# Patient Record
Sex: Male | Born: 1970 | Race: White | Hispanic: Yes | Marital: Married | Smoking: Never smoker
Health system: Southern US, Community
[De-identification: ages and names within clinical notes are randomized; demographics above are authoritative.]

## PROBLEM LIST (undated history)

## (undated) DIAGNOSIS — H1010 Acute atopic conjunctivitis, unspecified eye: Secondary | ICD-10-CM

## (undated) DIAGNOSIS — E7800 Pure hypercholesterolemia, unspecified: Secondary | ICD-10-CM

## (undated) HISTORY — DX: Pure hypercholesterolemia, unspecified: E78.00

## (undated) HISTORY — DX: Acute atopic conjunctivitis, unspecified eye: H10.10

---

## 2000-09-02 HISTORY — PX: APPENDECTOMY: SHX54

## 2008-06-07 ENCOUNTER — Emergency Department (HOSPITAL_COMMUNITY): Admission: EM | Admit: 2008-06-07 | Discharge: 2008-06-07 | Payer: Self-pay | Admitting: Emergency Medicine

## 2008-06-29 ENCOUNTER — Ambulatory Visit: Payer: Self-pay | Admitting: Internal Medicine

## 2008-06-30 ENCOUNTER — Ambulatory Visit: Payer: Self-pay | Admitting: *Deleted

## 2008-08-17 ENCOUNTER — Encounter: Payer: Self-pay | Admitting: Family Medicine

## 2008-08-17 ENCOUNTER — Ambulatory Visit: Payer: Self-pay | Admitting: Internal Medicine

## 2008-08-17 LAB — CONVERTED CEMR LAB
Alkaline Phosphatase: 84 units/L (ref 39–117)
Basophils Absolute: 0 10*3/uL (ref 0.0–0.1)
Basophils Relative: 0 % (ref 0–1)
Cholesterol: 217 mg/dL — ABNORMAL HIGH (ref 0–200)
Creatinine, Ser: 1.06 mg/dL (ref 0.40–1.50)
Eosinophils Relative: 2 % (ref 0–5)
Glucose, Bld: 97 mg/dL (ref 70–99)
HDL: 57 mg/dL (ref >39)
Hemoglobin: 16.6 g/dL (ref 13.0–17.0)
LDL Cholesterol: 145 mg/dL — ABNORMAL HIGH (ref 0–99)
Lymphs Abs: 1.7 10*3/uL (ref 0.7–4.0)
MCHC: 35 g/dL (ref 30.0–36.0)
Monocytes Absolute: 0.5 10*3/uL (ref 0.1–1.0)
Monocytes Relative: 8 % (ref 3–12)
Neutro Abs: 3.4 10*3/uL (ref 1.7–7.7)
Neutrophils Relative %: 60 % (ref 43–77)
Potassium: 3.9 meq/L (ref 3.5–5.3)
TSH: 1.473 microintl units/mL (ref 0.350–4.50)
Total Bilirubin: 0.8 mg/dL (ref 0.3–1.2)
Total CHOL/HDL Ratio: 3.8
Triglycerides: 73 mg/dL (ref <150)
VLDL: 15 mg/dL (ref 0–40)

## 2008-09-07 ENCOUNTER — Ambulatory Visit: Payer: Self-pay | Admitting: Internal Medicine

## 2009-02-08 ENCOUNTER — Ambulatory Visit: Payer: Self-pay | Admitting: Internal Medicine

## 2009-02-15 ENCOUNTER — Ambulatory Visit: Payer: Self-pay | Admitting: Family Medicine

## 2009-07-31 ENCOUNTER — Ambulatory Visit: Payer: Self-pay | Admitting: Internal Medicine

## 2009-08-29 ENCOUNTER — Ambulatory Visit: Payer: Self-pay | Admitting: Family Medicine

## 2011-06-04 LAB — POCT URINALYSIS DIP (DEVICE)
Bilirubin Urine: NEGATIVE
Glucose, UA: NEGATIVE
Hgb urine dipstick: NEGATIVE
Nitrite: NEGATIVE
Operator id: 30745
pH: 7

## 2013-09-22 ENCOUNTER — Ambulatory Visit: Payer: Self-pay | Admitting: Physician Assistant

## 2013-09-22 VITALS — BP 112/70 | HR 53 | Temp 98.2°F | Resp 16 | Ht 66.0 in | Wt 169.0 lb

## 2013-09-22 DIAGNOSIS — R21 Rash and other nonspecific skin eruption: Secondary | ICD-10-CM

## 2013-09-22 MED ORDER — DOXYCYCLINE HYCLATE 100 MG PO TABS: 100.0000 mg | ORAL_TABLET | Freq: Two times a day (BID) | ORAL | Status: DC

## 2013-09-22 MED ORDER — TRIAMCINOLONE ACETONIDE 0.1 % EX CREA: 1.0000 "application " | TOPICAL_CREAM | Freq: Every day | CUTANEOUS | Status: DC

## 2013-09-22 NOTE — Progress Notes (Signed)
   Subjective:    Patient ID: Joel Todd, male    DOB: [birth year], 43 y.o.   MRN: [id number]  HPI  Pt presents to clinic with a rash on his face for the last 3 weeks.  He thought that they were pimples at 1st but they never got worse but they also did not improve.  He has been on Amoxil for the last 3 days that he started for a sore throat but he has noticed no difference in his rash.  The rash is slightly itchy but when he pushes on the bumps they are sore.  They have not spread since they started.  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  He has not changed andy soaps or lotions.  He has used nothing for the rash.  Review of Systems     Objective:   Physical Exam  Vitals reviewed. Constitutional: He is oriented to person, place, and time. He appears well-developed and well-nourished.  HENT:  Head: Normocephalic and atraumatic.  Right Ear: External ear normal.  Left Ear: External ear normal.  Eyes: Conjunctivae are normal.  Pulmonary/Chest: Effort normal.  Neurological: He is alert and oriented to person, place, and time.  Skin: Skin is warm and dry. Rash (erythematous papular rash on cheeks across nose - there are no pustules present.  The rash does not extend into his facial hair area.  They are slighty  indurated when palpated and slightly TTP.) noted.  Psychiatric: He has a normal mood and affect. His behavior is normal. Judgment and thought content normal.      Assessment & Plan:  Rash of face - Plan: doxycycline (VIBRA-TABS) 100 MG tablet, triamcinolone cream (KENALOG) 0.1 %  This is an unusual place for folliculitis but it definitely has the appearance of that. If this does not resolve or it returns we need to consider possible Rosacea due to its distribution.  He currently has no systemic symptoms but if they would occur a biopsy would be helpful if the rash does not resolve.  He understands with the above  plan.  Benny LennertSarah Weber PA-C 09/22/2013 10:55 AM

## 2014-05-10 ENCOUNTER — Ambulatory Visit (INDEPENDENT_AMBULATORY_CARE_PROVIDER_SITE_OTHER): Payer: Self-pay | Admitting: Family Medicine

## 2014-05-10 VITALS — BP 116/72 | HR 60 | Temp 97.8°F | Resp 16 | Ht 66.0 in | Wt 167.8 lb

## 2014-05-10 DIAGNOSIS — R142 Eructation: Secondary | ICD-10-CM

## 2014-05-10 DIAGNOSIS — R140 Abdominal distension (gaseous): Secondary | ICD-10-CM

## 2014-05-10 DIAGNOSIS — R143 Flatulence: Secondary | ICD-10-CM

## 2014-05-10 DIAGNOSIS — R1031 Right lower quadrant pain: Secondary | ICD-10-CM

## 2014-05-10 DIAGNOSIS — K5900 Constipation, unspecified: Secondary | ICD-10-CM

## 2014-05-10 DIAGNOSIS — R141 Gas pain: Secondary | ICD-10-CM

## 2014-05-10 LAB — POCT CBC
GRANULOCYTE PERCENT: 72.5 % (ref 37–80)
HCT, POC: 46.2 % (ref 43.5–53.7)
HEMOGLOBIN: 15.7 g/dL (ref 14.1–18.1)
LYMPH, POC: 1.4 (ref 0.6–3.4)
MCH, POC: 31.9 pg — AB (ref 27–31.2)
MCHC: 33.9 g/dL (ref 31.8–35.4)
MCV: 93.9 fL (ref 80–97)
MID (CBC): 0.3 (ref 0–0.9)
MPV: 7.7 fL (ref 0–99.8)
POC GRANULOCYTE: 4.4 (ref 2–6.9)
POC LYMPH %: 22.9 % (ref 10–50)
POC MID %: 4.6 % (ref 0–12)
Platelet Count, POC: 21 10*3/uL — AB (ref 142–424)
RBC: 4.91 M/uL (ref 4.69–6.13)
RDW, POC: 13.4 %
WBC: 6.1 10*3/uL (ref 4.6–10.2)

## 2014-05-10 MED ORDER — OMEPRAZOLE 20 MG PO CPDR: 20.0000 mg | DELAYED_RELEASE_CAPSULE | Freq: Every day | ORAL | Status: DC

## 2014-05-10 NOTE — Progress Notes (Signed)
Subjective:    Patient ID: Joel Todd, male    DOB: 09-13-1970, 43 y.o.   MRN: [id number]  HPI Joel Todd is a 43 y.o. male  Here with c/o abdominal pain.  Started with small pinch in lower abdomen 3 weeks ago, then noticed stomach swells after eating over past 3 weeks. Swelling improves after few hours.  Increased gas with flatus and belching. Symptoms same, no worsening, just persistent swelling after eating and soreness in lower right abdomen. No dysuria, no hematuria.  No fever. No n/v, no diarrhea. BM QD now, but every other day initially - denies hard stools, no rectal bleeding.  No prior similar sx's. No weight loss, no night sweats.   Tx: none   SH: alcohol on weekends, 8 beers at a time, but not every weekend. Not drinking during the week.    There are no active problems to display for this patient.  History reviewed. No pertinent past medical history. History reviewed. No pertinent past surgical history. No Known Allergies Prior to Admission medications   Not on File   History   Social History  . Marital Status: Married    Spouse Name: N/A    Number of Children: N/A  . Years of Education: N/A   Occupational History  . Not on file.   Social History Main Topics  . Smoking status: Never Smoker   . Smokeless tobacco: Not on file  . Alcohol Use: Not on file  . Drug Use: Not on file  . Sexual Activity: Not on file   Other Topics Concern  . Not on file   Social History Narrative  . No narrative on file       Review of Systems  Constitutional: Negative for fever and chills.  Gastrointestinal: Positive for abdominal pain and abdominal distention. Negative for nausea, vomiting, diarrhea, blood in stool and anal bleeding.  Genitourinary: Negative for dysuria, urgency, frequency, hematuria and difficulty urinating.  Skin: Negative for rash.       Objective:   Physical Exam  Vitals reviewed. Constitutional: He is oriented to  person, place, and time. He appears well-developed and well-nourished.  HENT:  Head: Normocephalic and atraumatic.  Eyes: EOM are normal. Pupils are equal, round, and reactive to light.  Neck: No JVD present. Carotid bruit is not present.  Cardiovascular: Normal rate, regular rhythm and normal heart sounds.   No murmur heard. Pulmonary/Chest: Effort normal and breath sounds normal. He has no rales.  Abdominal: There is tenderness (min RLQ only. ) in the right lower quadrant. There is no rebound, no CVA tenderness and negative Murphy's sign. McBurney's point: minimal over mcburneys with deep palpation. No hernia. Hernia confirmed negative in the right inguinal area and confirmed negative in the left inguinal area.  Musculoskeletal: He exhibits no edema.  Neurological: He is alert and oriented to person, place, and time.  Skin: Skin is warm, dry and intact.  Psychiatric: He has a normal mood and affect.   Filed Vitals:   05/10/14 0930  BP: 116/72  Pulse: 60  Temp: 97.8 F (36.6 C)  TempSrc: Oral  Resp: 16  Height:  (1.676 m)  Weight: 167 lb 12.8 oz (76.114 kg)  SpO2: 100%    Results for orders placed in visit on 05/10/14  POCT CBC      Result Value Ref Range   WBC 6.1  4.6 - 10.2 K/uL   Lymph, poc 1.4  0.6 - 3.4   POC LYMPH  PERCENT 22.9  10 - 50 %L   MID (cbc) 0.3  0 - 0.9   POC MID % 4.6  0 - 12 %M   POC Granulocyte 4.4  2 - 6.9   Granulocyte percent 72.5  37 - 80 %G   RBC 4.91  4.69 - 6.13 M/uL   Hemoglobin 15.7  14.1 - 18.1 g/dL   HCT, POC 16.1  09.6 - 53.7 %   MCV 93.9  80 - 97 fL   MCH, POC 31.9 (*) 27 - 31.2 pg   MCHC 33.9  31.8 - 35.4 g/dL   RDW, POC 04.5     Platelet Count, POC 21 (*) 142 - 424 K/uL   MPV 7.7  0 - 99.8 fL      Assessment & Plan:   Joel Todd is a 43 y.o. male Right lower quadrant abdominal pain - Plan: POCT CBC, Comprehensive metabolic panel, omeprazole (PRILOSEC) 20 MG capsule  Bloating - Plan: POCT CBC, Comprehensive  metabolic panel, omeprazole (PRILOSEC) 20 MG capsule  Unspecified constipation  Initial constipation likely, and may still be contributory. Reassuring CBC and does not appear distended on exam. Trial of colace, miralax if needed, bland foods, and with belching component- trial of PPI short term.  Check CMP with alcohol use history.  RTC if sx's persist - may need U/S or other imaging. Understanding expressed - portions discussed in Spanish.   Meds ordered this encounter  Medications  . omeprazole (PRILOSEC) 20 MG capsule    Sig: Take 1 capsule (20 mg total) by mouth daily.    Dispense:  30 capsule    Refill:  1   Patient Instructions  Avoid alcohol, spicy food. Fried or fatty foods. You should receive a call or letter about your lab results within the next week to 10 days. Start omeprazole  once per day. Ok to use stool softener - Colace once per day to soften stools, or if no bowel movement that day - can use miralax over the counter up to once per day. Recheck in next 4-5 days to determine next step Return to the clinic or go to the nearest emergency room if any of your symptoms worsen or new symptoms occur.  Abdominal Pain Many things can cause abdominal pain. Usually, abdominal pain is not caused by a disease and will improve without treatment. It can often be observed and treated at home. Your health care provider will do a physical exam and possibly order blood tests and X-rays to help determine the seriousness of your pain. However, in many cases, more time must pass before a clear cause of the pain can be found. Before that point, your health care provider may not know if you need more testing or further treatment. HOME CARE INSTRUCTIONS  Monitor your abdominal pain for any changes. The following actions may help to alleviate any discomfort you are experiencing:  Only take over-the-counter or prescription medicines as directed by your health care provider.  Do not take laxatives  unless directed to do so by your health care provider.  Try a clear liquid diet (broth, tea, or water) as directed by your health care provider. Slowly move to a bland diet as tolerated. SEEK MEDICAL CARE IF:  You have unexplained abdominal pain.  You have abdominal pain associated with nausea or diarrhea.  You have pain when you urinate or have a bowel movement.  You experience abdominal pain that wakes you in the night.  You have abdominal  pain that is worsened or improved by eating food.  You have abdominal pain that is worsened with eating fatty foods.  You have a fever. SEEK IMMEDIATE MEDICAL CARE IF:   Your pain does not go away within 2 hours.  You keep throwing up (vomiting).  Your pain is felt only in portions of the abdomen, such as the right side or the left lower portion of the abdomen.  You pass bloody or black tarry stools. MAKE SURE YOU:  Understand these instructions.   Will watch your condition.   Will get help right away if you are not doing well or get worse.  Document Released: 05/29/2005 Document Revised: 08/24/2013 Document Reviewed: 04/28/2013 [facility] Patient Information 2015 [place], [place]. This information is not intended to replace advice given to you by your health care provider. Make sure you discuss any questions you have with your health care provider.

## 2014-05-10 NOTE — Patient Instructions (Addendum)
Avoid alcohol, spicy food. Fried or fatty foods. You should receive a call or letter about your lab results within the next week to 10 days. Start omeprazole  once per day. Ok to use stool softener - Colace once per day to soften stools, or if no bowel movement that day - can use miralax over the counter up to once per day. Recheck in next 4-5 days to determine next step Return to the clinic or go to the nearest emergency room if any of your symptoms worsen or new symptoms occur.  Abdominal Pain Many things can cause abdominal pain. Usually, abdominal pain is not caused by a disease and will improve without treatment. It can often be observed and treated at home. Your health care provider will do a physical exam and possibly order blood tests and X-rays to help determine the seriousness of your pain. However, in many cases, more time must pass before a clear cause of the pain can be found. Before that point, your health care provider may not know if you need more testing or further treatment. HOME CARE INSTRUCTIONS  Monitor your abdominal pain for any changes. The following actions may help to alleviate any discomfort you are experiencing:  Only take over-the-counter or prescription medicines as directed by your health care provider.  Do not take laxatives unless directed to do so by your health care provider.  Try a clear liquid diet (broth, tea, or water) as directed by your health care provider. Slowly move to a bland diet as tolerated. SEEK MEDICAL CARE IF:  You have unexplained abdominal pain.  You have abdominal pain associated with nausea or diarrhea.  You have pain when you urinate or have a bowel movement.  You experience abdominal pain that wakes you in the night.  You have abdominal pain that is worsened or improved by eating food.  You have abdominal pain that is worsened with eating fatty foods.  You have a fever. SEEK IMMEDIATE MEDICAL CARE IF:   Your pain does not go  away within 2 hours.  You keep throwing up (vomiting).  Your pain is felt only in portions of the abdomen, such as the right side or the left lower portion of the abdomen.  You pass bloody or black tarry stools. MAKE SURE YOU:  Understand these instructions.   Will watch your condition.   Will get help right away if you are not doing well or get worse.  Document Released: 05/29/2005 Document Revised: 08/24/2013 Document Reviewed: 04/28/2013 [facility] Patient Information 2015 [place], [place]. This information is not intended to replace advice given to you by your health care provider. Make sure you discuss any questions you have with your health care provider.

## 2014-05-11 LAB — COMPREHENSIVE METABOLIC PANEL
ALK PHOS: 57 U/L (ref 39–117)
ALT: 13 U/L (ref 0–53)
AST: 16 U/L (ref 0–37)
Albumin: 4.6 g/dL (ref 3.5–5.2)
BUN: 13 mg/dL (ref 6–23)
CALCIUM: 9.7 mg/dL (ref 8.4–10.5)
CO2: 30 meq/L (ref 19–32)
CREATININE: 0.84 mg/dL (ref 0.50–1.35)
Chloride: 103 mEq/L (ref 96–112)
Glucose, Bld: 94 mg/dL (ref 70–99)
POTASSIUM: 4.5 meq/L (ref 3.5–5.3)
SODIUM: 141 meq/L (ref 135–145)
Total Bilirubin: 0.5 mg/dL (ref 0.2–1.2)
Total Protein: 7.2 g/dL (ref 6.0–8.3)

## 2015-05-08 ENCOUNTER — Ambulatory Visit (INDEPENDENT_AMBULATORY_CARE_PROVIDER_SITE_OTHER): Payer: Self-pay | Admitting: Family Medicine

## 2015-05-08 VITALS — BP 104/76 | HR 80 | Temp 98.0°F | Resp 14 | Ht 66.0 in | Wt 169.0 lb

## 2015-05-08 DIAGNOSIS — G44209 Tension-type headache, unspecified, not intractable: Secondary | ICD-10-CM

## 2015-05-08 DIAGNOSIS — M79622 Pain in left upper arm: Secondary | ICD-10-CM

## 2015-05-08 DIAGNOSIS — L719 Rosacea, unspecified: Secondary | ICD-10-CM

## 2015-05-08 DIAGNOSIS — R079 Chest pain, unspecified: Secondary | ICD-10-CM

## 2015-05-08 DIAGNOSIS — R42 Dizziness and giddiness: Secondary | ICD-10-CM

## 2015-05-08 DIAGNOSIS — H9202 Otalgia, left ear: Secondary | ICD-10-CM

## 2015-05-08 LAB — GLUCOSE, POCT (MANUAL RESULT ENTRY): POC Glucose: 85 mg/dl (ref 70–99)

## 2015-05-08 MED ORDER — BUTALBITAL-APAP-CAFFEINE 50-325-40 MG PO TABS: 1.0000 | ORAL_TABLET | Freq: Four times a day (QID) | ORAL | Status: AC | PRN

## 2015-05-08 MED ORDER — MECLIZINE HCL 25 MG PO TABS
ORAL_TABLET | ORAL | Status: DC
Start: 2015-05-08 — End: 2016-03-22

## 2015-05-08 MED ORDER — METRONIDAZOLE 1 % EX GEL: CUTANEOUS | Status: DC

## 2015-05-08 NOTE — Progress Notes (Signed)
Dizziness Subjective:  Patient ID: Joel Todd, male    DOB: 1970-12-17  Age: 44 y.o. MRN: [id number]  44 year old man who has been having problems with dizziness last few days. It is not totally predictable. Sometimes he can be working and just get a little lightheaded dizziness. He does not have enough dizziness to cause him any nausea or vomiting. It is not bothered him when he turns over in bed. Sometimes it is a little bit positional. He has not had problems with this in the past. He has had a little bit of a shooting pain in his left ear since yesterday. He does not smoke. He drinks occasionally. He does not use any drugs. He knows of no head injuries. He has been having some headaches. He is not hurting right this minute. He has had some pains in his left shoulder. 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 He has a family. He is generally healthy. He is not on any regular medications and is not taking any OTC medications. He also describes some intermittent shooting type pains in his left upper chest like an electrical impulse going through him.   Objective:   Healthy-appearing gentleman in no major distress. His TMs are entirely normal. Eyes PERRLA. Fundi benign. Throat clear. Neck supple without nodes or thyromegaly. No carotid bruits. Chest is clear to auscultation. Heart regular without murmurs, gallops, or arrhythmias. Abdomen soft. Motor symmetrical. Head position does not seem to trigger dizziness.  Assessment & Plan:   Assessment:  Nonspecific dizziness Left otalgia  Plan:  Check a blood sugar on him. Because of the left shoulder pains we'll also get an EKG.  Normal EKG  Results for orders placed or performed in visit on 05/08/15  POCT glucose (manual entry)  Result Value Ref Range   POC Glucose 85 70 - 99 mg/dl     Patient incidentally also complained of a rash on his face that he has had for the past year. This appears to be rosacea.  Will treat him for that.  The dizziness is nonspecific. Will treat with Antivert. Gave some Fioricet for when necessary use for headaches. Urged him to get regular exercise.  He is to return if needed.   Patient Instructions  Drink lots of water  Advise getting regular exercise such as taking a daily walk to help you relax  Take the meclizine one half to one pill 2 or 3 times a day only when needed for dizziness  Take the Fioricet (butalbutal) 1 pill every 6 hours when needed for headache. Mareos (Dizziness) Los mareos son un problema muy frecuente. Se trata de una sensacin de inestabilidad o aturdimiento. Puede sentir que se va a desvanecer. Puede provocarle una lesin si se tropieza o se cae. Las Dealer de cualquier edad pueden sufrir mareos, pero es ms frecuente [employer] ancianos. CAUSAS  La causa puede deberse a muchos problemas diferentes:  Problemas en el odo medio.  Estar de pie [employer]Ex.  Infecciones.  Reacciones alrgicas.  Envejecimiento.  Respuesta emocional a distintas cosas, como por ejemplo la visin de sangre.  Efectos secundarios de Nature conservation officer.  Cansancio.  Problemas circulatorios o de presin arterial.  Consumo excesivo de alcohol, medicamentos o drogas.  Respiracin muy rpida (hiperventilacin).  Ritmo cardaco irregular (arritmia).  Bajo recuento de glbulos rojos (anemia).  Embarazo.  Vmitos, diarrea, fiebre u otras enfermedades que causan la prdida de lquidos (deshidratacin).  Enfermedades o trastornos, como enfermedad de  Parkinson, presin alta (hipertensin arterial), diabetes y problemas tiroideos.  Exposicin al calor extremo. DIAGNSTICO  El mdico le preguntar acerca de los sntomas, y Education officer, environmental un examen fsico y un electrocardiograma (ECG) para registrar la actividad elctrica del corazn. Tambin podr realizarle otras pruebas cardacas o anlisis de sangre para determinar la causa de los [place]. Estos  pueden ser:  Ecocardiograma transtorcico (ETT). Durante Management consultant, se usan ondas sonoras para evaluar cmo fluye la sangre por el corazn.  Ecocardiograma transesofgico (ETE).  Monitoreo cardaco. Este estudio permite que el mdico controle la frecuencia y el ritmo cardaco en tiempo real.  Monitor Holter. Es un dispositivo porttil que [employer] latidos cardacos y [place] a Education administrator las arritmias cardacas. Le permite al [employer] registrar la actividad cardaca durante varios das, si es necesario.  Pruebas de estrs por ejercicio o por medicamentos que aceleran los latidos cardacos. TRATAMIENTO  El tratamiento de los mareos depende de la causa de los sntomas y Advertising account planner. INSTRUCCIONES PARA EL CUIDADO EN EL HOGAR   Beba suficiente lquido para mantener la orina clara o de color amarillo plido. Esto es realmente importante cuando el clima es muy caluroso. En los adultos [place], tambin es importante cuando hace fro.  Tome los medicamentos exactamente como se lo hayan indicado, si los mareos se deben a los medicamentos. Cuando tome medicamentos para la presin arterial, es muy importante que se levante lentamente.  Levntese de las sillas con lentitud y apyese hasta sentirse bien.  Por la maana, sintese primero a un lado de la cama. Cuando se sienta bien, pngase lentamente de [address] se sostiene de algo, hasta que sepa que ha logrado el equilibrio.  Mueva las piernas con frecuencia si debe estar de pie en un lugar durante mucho tiempo. Mientras est de pie, contraiga y relaje los msculos de las piernas.  Pdale a alguna persona que lo acompae durante 1 o 2das si los mareos siguen siendo un problema. Debe estar acompaado hasta que se sienta lo suficientemente bien como para estar solo. Pdale a la persona que llame al mdico si observa cambios en usted que sean preocupantes.  No conduzca vehculos ni utilice maquinarias pesadas si se siente  mareado.  No beba alcohol. SOLICITE ATENCIN MDICA DE INMEDIATO SI:   Los mareos o el aturdimiento [place].  Siente nuseas o vomita.  Tiene dificultad para hablar, para caminar o para [employer], las manos o las piernas.  Se siente dbil.  No piensa con claridad o tiene dificultades para armar oraciones. Es posible que un amigo o un familiar adviertan que esto ocurre.  Tiene dolor de pecho, dolor abdominal, sudoracin o Company secretary.  Hay cambios en la visin.  Observa un sangrado.  Tiene efectos secundarios del medicamento que parecen Consulting civil engineer de Scientist, clinical (histocompatibility and immunogenetics). ASEGRESE DE QUE:   Comprende estas instrucciones.  Controlar su afeccin.  Recibir ayuda de inmediato si no mejora o si empeora. Document Released: 08/19/2005 Document Revised: 08/24/2013 [facility] Patient Information 2015 [place], [place]. This information is not intended to replace advice given to you by your health care provider. Make sure you discuss any questions you have with your health care provider. Roscea  (Rosacea)  La roscea es una enfermedad crnica que afecta la piel de la cara ([place], nariz, frente y [place]) y en algunos casos, los ojos. La roscea hace que los vasos sanguneos que se encuentran cerca de la superficie de la piel se agranden y causen enrojecimiento. Esta afeccin generalmente comienza  despus de los 30 aos. Se presenta con mayor frecuencia en mujeres de piel clara. Si no se trata, tiende a Product/process development scientist. No hay cura para la roscea, pero el tratamiento puede ayudar a [employer] sntomas.  CAUSAS  La causa es desconocida. Se cree que algunas personas heredan la tendencia a Environmental education officer roscea. Algunos factores desencadenantes pueden hacer que empeore, por ejemplo:   Los baos calientes.  La actividad fsica.  La luz del sol.  Las temperaturas muy clidas o fras.  Los alimentos y las bebidas muy calientes o condimentadas.  El consumo de  alcohol.  El estrs.  Los medicamentos para la presin arterial.  El uso de corticoides tpicos en el rostro por un tiempo prolongado. SNTOMAS   Enrojecimiento del rostro.  Bultos o granos rojos en la cara.  Ocie [place], agrandada (rinofima).  Se ruboriza fcilmente.  Lneas rojas en la piel.  Irritacin o sensacin de ardor en los ojos.  Prpados hinchados. DIAGNSTICO  El mdico podr hacer un diagnstico preguntndole los sntomas y haciendo un examen fsico.  Pharmacist, hospital lo que la ocasiona es una parte importante del [place]. Tambin tendr que ver a un especialista de la piel (dermatlogo) para que realice un plan de tratamiento para usted. Los [employer] del tratamiento son Scientist, physiological enfermedad y Scientist, clinical (histocompatibility and immunogenetics) la apariencia de su piel. Puede llevar varias semanas o meses de tratamiento antes de que note una mejora en la piel. An despus de que su piel mejore, es probable que necesite continuar con el tratamiento para evitar que la roscea vuelva a Research officer, trade union. Los mtodos de tratamiento pueden ser:   Uso de Environmental manager o pantalla solar todos los das para proteger la piel.  Antibiticos, como el metronidazol, aplicado directamente [employer].  Antibiticos por va oral. Se recetan en caso de que tenga problemas en los ojos debido a la roscea.  Ciruga con lser para mejorar la apariencia de la piel. Esta ciruga puede reducir la aparicin de lneas rojas en la piel y eliminar el exceso de tejido de la nariz para reducir su tamao. INSTRUCCIONES PARA EL CUIDADO EN EL HOGAR   Evite lo que parece causar los brotes.  Si le han recetado antibiticos, tmelos segn las indicaciones. Tmelos todos, aunque se sienta mejor.  Use un limpiador facial suave que no contenga alcohol.  Puede utilizar un humectante facial suave.  Use pantalla solar con SPF 30  ms.  Si es necesario, use una base en polvo con tinte verde para ocultar el enrojecimiento. Elija cosmticos no  comedognicos. Esto significa que no obstruyen los poros.  Si tiene [employer] prpados, aplique un pao tibio. Cambie el vendaje 4 veces por da o como le aconsej su mdico. SOLICITE ATENCIN MDICA SI:   Los problemas de la piel empeoran.  Se siente deprimido.  Pierde el apetito.  Tiene dificultad para respirar.  Tiene problemas en los ojos, como enrojecimiento o picazn. ASEGRESE DE QUE:   Comprende estas instrucciones.  Controlar su enfermedad.  Solicitar ayuda de inmediato si no mejora o si empeora. Document Released: 08/19/2005 Document Revised: 02/18/2012 [facility] Patient Information 2015 [place], [place]. This information is not intended to replace advice given to you by your health care provider. Make sure you discuss any questions you have with your health care provider.      HOPPER,DAVID, MD 05/08/2015

## 2015-05-08 NOTE — Patient Instructions (Addendum)
Drink lots of water  Advise getting regular exercise such as taking a daily walk to help you relax  Take the meclizine one half to one pill 2 or 3 times a day only when needed for dizziness  Take the Fioricet (butalbutal) 1 pill every 6 hours when needed for headache.  Use the MetroGel twice daily on the face for a couple of weeks, then once daily. Mareos (Dizziness) Los mareos son un problema muy frecuente. Se trata de una sensacin de inestabilidad o aturdimiento. Puede sentir que se va a desvanecer. Puede provocarle una lesin si se tropieza o se cae. Las Dealer de cualquier edad pueden sufrir mareos, pero es ms frecuente [employer] ancianos. CAUSAS  La causa puede deberse a muchos problemas diferentes:  Problemas en el odo medio.  Estar de pie FedEx.  Infecciones.  Reacciones alrgicas.  Envejecimiento.  Respuesta emocional a distintas cosas, como por ejemplo la visin de sangre.  Efectos secundarios de Nature conservation officer.  Cansancio.  Problemas circulatorios o de presin arterial.  Consumo excesivo de alcohol, medicamentos o drogas.  Respiracin muy rpida (hiperventilacin).  Ritmo cardaco irregular (arritmia).  Bajo recuento de glbulos rojos (anemia).  Embarazo.  Vmitos, diarrea, fiebre u otras enfermedades que causan la prdida de lquidos (deshidratacin).  Enfermedades o trastornos, como enfermedad de Parkinson, presin alta (hipertensin arterial), diabetes y problemas tiroideos.  Exposicin al calor extremo. DIAGNSTICO  El mdico le preguntar acerca de los sntomas, y Education officer, environmental un examen fsico y un electrocardiograma (ECG) para registrar la actividad elctrica del corazn. Tambin podr realizarle otras pruebas cardacas o anlisis de sangre para determinar la causa de los [place]. Estos pueden ser:  Ecocardiograma transtorcico (ETT). Durante Management consultant, se usan ondas sonoras para evaluar cmo fluye la sangre por el  corazn.  Ecocardiograma transesofgico (ETE).  Monitoreo cardaco. Este estudio permite que el mdico controle la frecuencia y el ritmo cardaco en tiempo real.  Monitor Holter. Es un dispositivo porttil que eBay latidos cardacos y [place] a Education administrator las arritmias cardacas. Le permite al [employer] registrar la actividad cardaca durante varios das, si es necesario.  Pruebas de estrs por ejercicio o por medicamentos que aceleran los latidos cardacos. TRATAMIENTO  El tratamiento de los mareos depende de la causa de los sntomas y Advertising account planner. INSTRUCCIONES PARA EL CUIDADO EN EL HOGAR   Beba suficiente lquido para mantener la orina clara o de color amarillo plido. Esto es realmente importante cuando el clima es muy caluroso. En los adultos [place], tambin es importante cuando hace fro.  Tome los medicamentos exactamente como se lo hayan indicado, si los mareos se deben a los medicamentos. Cuando tome medicamentos para la presin arterial, es muy importante que se levante lentamente.  Levntese de las sillas con lentitud y apyese hasta sentirse bien.  Por la maana, sintese primero a un lado de la cama. Cuando se sienta bien, pngase lentamente de [address] se sostiene de algo, hasta que sepa que ha logrado el equilibrio.  Mueva las piernas con frecuencia si debe estar de pie en un lugar durante mucho tiempo. Mientras est de pie, contraiga y relaje los msculos de las piernas.  Pdale a alguna persona que lo acompae durante 1 o 2das si los mareos siguen siendo un problema. Debe estar acompaado hasta que se sienta lo suficientemente bien como para estar solo. Pdale a la persona que llame al mdico si observa cambios en usted que sean preocupantes.  No conduzca vehculos ni utilice maquinarias pesadas  si se siente mareado.  No beba alcohol. SOLICITE ATENCIN MDICA DE INMEDIATO SI:   Los mareos o el aturdimiento [place].  Siente nuseas o  vomita.  Tiene dificultad para hablar, para caminar o para [employer], las manos o las piernas.  Se siente dbil.  No piensa con claridad o tiene dificultades para armar oraciones. Es posible que un amigo o un familiar adviertan que esto ocurre.  Tiene dolor de pecho, dolor abdominal, sudoracin o Company secretary.  Hay cambios en la visin.  Observa un sangrado.  Tiene efectos secundarios del medicamento que parecen Consulting civil engineer de Scientist, clinical (histocompatibility and immunogenetics). ASEGRESE DE QUE:   Comprende estas instrucciones.  Controlar su afeccin.  Recibir ayuda de inmediato si no mejora o si empeora. Document Released: 08/19/2005 Document Revised: 08/24/2013 [facility] Patient Information 2015 [place], [place]. This information is not intended to replace advice given to you by your health care provider. Make sure you discuss any questions you have with your health care provider. Roscea  (Rosacea)  La roscea es una enfermedad crnica que afecta la piel de la cara ([place], nariz, frente y [place]) y en algunos casos, los ojos. La roscea hace que los vasos sanguneos que se encuentran cerca de la superficie de la piel se agranden y causen enrojecimiento. Esta afeccin generalmente comienza despus de los 30 aos. Se presenta con mayor frecuencia en mujeres de piel clara. Si no se trata, tiende a Product/process development scientist. No hay cura para la roscea, pero el tratamiento puede ayudar a [employer] sntomas.  CAUSAS  La causa es desconocida. Se cree que algunas personas heredan la tendencia a Environmental education officer roscea. Algunos factores desencadenantes pueden hacer que empeore, por ejemplo:   Los baos calientes.  La actividad fsica.  La luz del sol.  Las temperaturas muy clidas o fras.  Los alimentos y las bebidas muy calientes o condimentadas.  El consumo de alcohol.  El estrs.  Los medicamentos para la presin arterial.  El uso de corticoides tpicos en el rostro por un tiempo  prolongado. SNTOMAS   Enrojecimiento del rostro.  Bultos o granos rojos en la cara.  Ocie [place], agrandada (rinofima).  Se ruboriza fcilmente.  Lneas rojas en la piel.  Irritacin o sensacin de ardor en los ojos.  Prpados hinchados. DIAGNSTICO  El mdico podr hacer un diagnstico preguntndole los sntomas y haciendo un examen fsico.  Pharmacist, hospital lo que la ocasiona es una parte importante del [place]. Tambin tendr que ver a un especialista de la piel (dermatlogo) para que realice un plan de tratamiento para usted. Los PepsiCo del tratamiento son Scientist, physiological enfermedad y Scientist, clinical (histocompatibility and immunogenetics) la apariencia de su piel. Puede llevar varias semanas o meses de tratamiento antes de que note una mejora en la piel. An despus de que su piel mejore, es probable que necesite continuar con el tratamiento para evitar que la roscea vuelva a Research officer, trade union. Los mtodos de tratamiento pueden ser:   Uso de Environmental manager o pantalla solar todos los das para proteger la piel.  Antibiticos, como el metronidazol, aplicado directamente International Paper.  Antibiticos por va oral. Se recetan en caso de que tenga problemas en los ojos debido a la roscea.  Ciruga con lser para mejorar la apariencia de la piel. Esta ciruga puede reducir la aparicin de lneas rojas en la piel y eliminar el exceso de tejido de la nariz para reducir su tamao. INSTRUCCIONES PARA EL CUIDADO EN EL HOGAR   Evite lo que parece causar  los brotes.  Si le han recetado antibiticos, tmelos segn las indicaciones. Tmelos todos, aunque se sienta mejor.  Use un limpiador facial suave que no contenga alcohol.  Puede utilizar un humectante facial suave.  Use pantalla solar con SPF 30  ms.  Si es necesario, use una base en polvo con tinte verde para ocultar el enrojecimiento. Elija cosmticos no comedognicos. Esto significa que no obstruyen los poros.  Si tiene [employer] prpados, aplique un pao tibio. Cambie el  vendaje 4 veces por da o como le aconsej su mdico. SOLICITE ATENCIN MDICA SI:   Los problemas de la piel empeoran.  Se siente deprimido.  Pierde el apetito.  Tiene dificultad para respirar.  Tiene problemas en los ojos, como enrojecimiento o picazn. ASEGRESE DE QUE:   Comprende estas instrucciones.  Controlar su enfermedad.  Solicitar ayuda de inmediato si no mejora o si empeora. Document Released: 08/19/2005 Document Revised: 02/18/2012 [facility] Patient Information 2015 [place], [place]. This information is not intended to replace advice given to you by your health care provider. Make sure you discuss any questions you have with your health care provider.

## 2015-09-29 LAB — POCT GLUCOSE (2 HR PP): Glucose 2 Hr PP, POC: 92 mg/dL

## 2015-10-18 ENCOUNTER — Encounter: Payer: Self-pay | Admitting: Internal Medicine

## 2015-10-18 ENCOUNTER — Ambulatory Visit (INDEPENDENT_AMBULATORY_CARE_PROVIDER_SITE_OTHER): Payer: Self-pay | Admitting: Internal Medicine

## 2015-10-18 VITALS — BP 130/88 | HR 56 | Resp 16 | Ht 65.25 in | Wt 168.0 lb

## 2015-10-18 DIAGNOSIS — H524 Presbyopia: Secondary | ICD-10-CM | POA: Insufficient documentation

## 2015-10-18 DIAGNOSIS — R51 Headache: Secondary | ICD-10-CM

## 2015-10-18 DIAGNOSIS — R519 Headache, unspecified: Secondary | ICD-10-CM | POA: Insufficient documentation

## 2015-10-18 DIAGNOSIS — R030 Elevated blood-pressure reading, without diagnosis of hypertension: Secondary | ICD-10-CM

## 2015-10-18 DIAGNOSIS — L719 Rosacea, unspecified: Secondary | ICD-10-CM | POA: Insufficient documentation

## 2015-10-18 DIAGNOSIS — K029 Dental caries, unspecified: Secondary | ICD-10-CM

## 2015-10-18 MED ORDER — METRONIDAZOLE 1 % EX GEL: CUTANEOUS | Status: DC

## 2015-10-18 NOTE — Patient Instructions (Addendum)
Can google "advance directives, [place]"  And bring up form from Secretary of [place]. Print and fill out Or can go to "5 wishes"  Which is also in Spanish and fill out--this costs $5--perhaps easier to use. Designate a Cytogeneticist to speak for you if you are unable to speak for yourself when ill or injured  Try Ibuprofen 200 mg tabs 2-4 tabs every 6 hours as needed for headache--always take with food. Let us know if your glasses do not help with the headaches after you have worn them regularly for about a month. If they do not, will send you to the eye doctor again

## 2015-10-18 NOTE — Progress Notes (Signed)
   Subjective:    Patient ID: Joel Todd, male    DOB: 1971-07-13, 45 y.o.   MRN: [id number]  HPI   1.  Headaches:  Has had in past--was given Fioricet for them in September of 2015.  Started having these again 1 month ago.  Bifrontal-temorporal.  Feels like his heart beating in head.  Occurs 3 times weekly.  No definite aura.  Sometimes gets nausea with headache.  No vomiting.  No numbness, tingling or weakness with headaches. No definite photo or phonophobia. Last about 2-3 hours if takes medication.  Has not taken Fioricet for these headaches--cannot say why. Has tried Tylenol 500 mg two tabs with resulting headache for 2-3 hours as above. Does not sound like he has take an NSAID. Has also noted difficulties with vision in past year.  Light bothers his eyes now.  Takes a while for his eyes to adjust to changing light.  Did go to an eye doctor and received prescription glasses He feels he was just given glasses for distance vision, but is not sure if they are bifocals and did not utilize them as bifocals.  Got dizzy wearing them as well. Does feel stressed often.  Not interested in any counseling.  2. Elevated BP:  BP up a bit recently at a health fair.  +FH of hypertension.    3.  Acne Rosacea:  Has had diffculty with this he feels for 1 year.  Was given Rx for Metronidazole gel 1% Sept of 2016.  He used for about 1 month, but did not feel it helped.  Actually, what happens is it goes away, but returns when he stops using.    Meds:  None currently  Allergies:  NKDA  Review of Systems     Objective:   Physical Exam HEENT:  PERRL, EOMI, discs sharp, no exudates or hemorrhages.  TMs pearly gray, throat without injection or exudate.  Dental decay. Neck:  Supple, no adenopathy, no thyromegatly Chest:  CTA CV:  RRR with normal S1 and S2, No S3, S4, or murmur appreciated.  Carotid, radial pulses normal and equal Abd:  S, NT, No HSM or masses Neuro:  A & O x 3, CN II-XII  grossly intact, DTRs 2+/4 throughout, Motor is 5/5 throughout, sensory grossly normal.  Normal gait. Skin:  Scattered erythematous areas on forehead and cheeks.      Assessment & Plan:  1.  Headaches:  Possibly secondary to need to wear glasses.  Discussed need to wear glasses regularly to get used to bifocals and not unusual to have some difficulties with some vertigo when learning how to use bifocal lenses.  If he finds that the lenses are the same top to bottom, to look over the top for distance vision.  2.  Recent elevated BP:  BP high normal today.  Follow.  Recommend regular physical activity and healthy diet high in fruits and vegetables.  3.  Acne Rosacea:  Discussed Metronidazole is least expensive treatment and need to stay on it. Will not cure, but treat.  Rx sent to [facility] could afford the price.  4.  Dental Decay:  Dental referral.

## 2015-10-28 ENCOUNTER — Encounter: Payer: Self-pay | Admitting: Internal Medicine

## 2016-02-16 ENCOUNTER — Ambulatory Visit: Payer: Self-pay

## 2016-02-16 VITALS — BP 121/80 | HR 54

## 2016-02-16 DIAGNOSIS — R030 Elevated blood-pressure reading, without diagnosis of hypertension: Secondary | ICD-10-CM

## 2016-03-19 ENCOUNTER — Encounter: Payer: No Typology Code available for payment source | Admitting: Internal Medicine

## 2016-03-22 ENCOUNTER — Encounter: Payer: Self-pay | Admitting: Internal Medicine

## 2016-03-22 ENCOUNTER — Ambulatory Visit (INDEPENDENT_AMBULATORY_CARE_PROVIDER_SITE_OTHER): Payer: No Typology Code available for payment source | Admitting: Internal Medicine

## 2016-03-22 VITALS — BP 120/70 | HR 60 | Resp 16 | Ht 65.0 in | Wt 167.0 lb

## 2016-03-22 DIAGNOSIS — E041 Nontoxic single thyroid nodule: Secondary | ICD-10-CM

## 2016-03-22 DIAGNOSIS — E785 Hyperlipidemia, unspecified: Secondary | ICD-10-CM

## 2016-03-22 DIAGNOSIS — Z23 Encounter for immunization: Secondary | ICD-10-CM

## 2016-03-22 DIAGNOSIS — Z0000 Encounter for general adult medical examination without abnormal findings: Secondary | ICD-10-CM

## 2016-03-22 NOTE — Progress Notes (Signed)
Subjective:    Patient ID: Joel Todd, male    DOB: Jan 30, 1971, 45 y.o.   MRN: [id number]  HPI   Here for CPE  Concerns:    1. Vision concerns:  Describes his vision blurry under fluorescent lights and in his near vision.  Can see distance fine.  Has not tried reading glasses for close up  2.  Bilateral Knee pain:  Pain with getting up from chair after sitting for a bit.  Describes more stiffness in knees until up and moving around.  Similar problems with squatting to pain walls at lower levels.  Also describes pain in bilateral lateral thighs--high up.  Bothers when walking or sitting or more so lying on whichever side bothers him.    Health Maintenance:  1.  PSA:  Never.  Does not run in family. Never had digital exam as well.  2.  Cholesterol:  History of elevated cholesterol at 217 with HDL of 57 and LDL of 145.  Fasting today.  3.  Guaiac Cards:  Never.  4.  Colonoscopy:  Never  5.  Immunizations:  Had influenza 09/2015.  No Tdap yet or tetanus in past 10 years.    Current outpatient prescriptions:  .  metroNIDAZOLE (METROGEL) 1 % gel, Apply small amount to face twice daily, Disp: 60 g, Rfl: 11 .  butalbital-acetaminophen-caffeine (FIORICET) 50-325-40 MG per tablet, Take 1-2 tablets by mouth every 6 (six) hours as needed for headache. (Patient not taking: Reported on 10/18/2015), Disp: 20 tablet, Rfl: 0   No Known Allergies   History reviewed. No pertinent past medical history.   Past Surgical History  Procedure Laterality Date  . Appendectomy  2002   Social History   Social History  . Marital Status: Married    Spouse Name: Doralee AlbinoLupe Gonzalez  . Number of Children: 4  . Years of Education: 9   Occupational History  . House painter-mainly interior    Social History Main Topics  . Smoking status: Never Smoker   . Smokeless tobacco: Never Used  . Alcohol Use: 0.0 oz/week    0 Standard drinks or equivalent per week     Comment: once weekly-beer  . Drug  Use: No  . Sexual Activity:    Partners: Female    Pharmacist, hospitalBirth Control/ Protection: Surgical     Comment: Wife had tubal ligation   Other Topics Concern  . Not on file   Social History Narrative   [place]   [place]   Lives with his wife and 4 daughters        Family History  Problem Relation Age of Onset  . Hypertension Mother   . Benign prostatic hyperplasia Father   . Hypertension Brother         Review of Systems  Constitutional: Negative for fatigue and unexpected weight change.  HENT: Positive for dental problem. Negative for hearing loss.        Was referred to dental clinic in February, but has not heard from them.  Nasal congestion when in cold air  Eyes:       See HPI  Respiratory: Negative for cough and shortness of breath.   Cardiovascular: Negative for chest pain, palpitations and leg swelling.       "electrical shock"  In left chest to left arm--there and gone. Less than a second.   Can occur 10 times in a month or may have none--very variable.  Gastrointestinal: Negative for nausea, vomiting,  diarrhea, constipation and blood in stool.       No melena  Genitourinary: Negative for dysuria, urgency, frequency, decreased urine volume, discharge and penile pain.  Musculoskeletal:       See HPI:  Knees and upper outer thighs  Neurological: Negative for weakness and numbness.       Occasional headache for which he uses Fioricet  Psychiatric/Behavioral: Negative for suicidal ideas and dysphoric mood. The patient is not nervous/anxious.        Sometimes with sleep difficulties--sometimes stressed about work       Objective:   Physical Exam  Constitutional: He is oriented to person, place, and time. He appears well-developed and well-nourished.  HENT:  Head: Normocephalic and atraumatic.  Right Ear: Hearing, tympanic membrane, external ear and ear canal normal.  Left Ear: Hearing, tympanic membrane, external ear and ear canal normal.    Nose: Nose normal.  Mouth/Throat: Uvula is midline, oropharynx is clear and moist and mucous membranes are normal. Dental caries present. No oropharyngeal exudate.  Eyes: Conjunctivae and EOM are normal. Pupils are equal, round, and reactive to light.  Discs sharp bilaterally  Neck: Normal range of motion. Neck supple. Thyroid mass present. No thyromegaly present.  What feels to be a small thyroid nodule on left lobe  Cardiovascular: Normal rate, regular rhythm, S1 normal and S2 normal.  Exam reveals no S3, no S4 and no friction rub.   No murmur heard. No carotid bruits.  Carotid, radial, femoral, DP and PT pulses normal and equal.    Pulmonary/Chest: Effort normal and breath sounds normal.  Abdominal: Soft. Bowel sounds are normal. He exhibits no mass. There is no hepatosplenomegaly. There is no tenderness. No hernia. Hernia confirmed negative in the right inguinal area and confirmed negative in the left inguinal area.  Genitourinary: Penis normal. Rectal exam shows no mass and no tenderness. Guaiac negative stool. Prostate is not enlarged and not tender. Right testis shows no mass and no tenderness. Left testis shows no mass and no tenderness. Uncircumcised.  Musculoskeletal: Normal range of motion.  Tender over bilateral greater trochanter area  Bilateral Knees with full ROM, No effusion or erythema.  NT along joint lines, posteriorly and with compression of patella.  No pain or laxity on stress maneuvers of cruciates or collateral ligaments.  Lymphadenopathy:       Head (right side): No submental and no submandibular adenopathy present.       Head (left side): No submental and no submandibular adenopathy present.    He has no cervical adenopathy.    He has no axillary adenopathy.       Right: No inguinal and no supraclavicular adenopathy present.       Left: No inguinal and no supraclavicular adenopathy present.  Neurological: He is alert and oriented to person, place, and time. He has  normal strength and normal reflexes. No cranial nerve deficit or sensory deficit. Coordination and gait normal.  Skin: Skin is warm and dry. No lesion and no rash noted.  Psychiatric: He has a normal mood and affect. His behavior is normal. Judgment and thought content normal.          Assessment & Plan:  1.  Annual CPE CBC,  CMP, FLP Tdap Guaiac Cards x3 to return in 2 weeks.  2.  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  Keep quads strong with swimming exercises.  Avoid a lot of long time deep knee bending.  3.  Bilateral Trochanteric  Bursitis:  Stretches Ibuprofen 400-800 mg twice daily with food as needed.    4.  Left Thyroid Nodule:  TSH and thyroid ultrasound  5.  Dental:  Still waiting for referral spot--to call in 1 month if does not receive appt.

## 2016-03-22 NOTE — Patient Instructions (Addendum)
Ibuprofen 400-800 mg twice daily with food for 2 weeks when have flair of hip and or knee pain. Swim or walk in 3 feet of water at pool--get a Y scholarship for winter months Hip exercises twice daily

## 2016-03-23 LAB — CBC WITH DIFFERENTIAL/PLATELET
BASOS: 0 %
Basophils Absolute: 0 10*3/uL (ref 0.0–0.2)
EOS (ABSOLUTE): 0.1 10*3/uL (ref 0.0–0.4)
Eos: 1 %
Hematocrit: 44.1 % (ref 37.5–51.0)
Hemoglobin: 15.7 g/dL (ref 12.6–17.7)
IMMATURE GRANULOCYTES: 0 %
Immature Grans (Abs): 0 10*3/uL (ref 0.0–0.1)
Lymphocytes Absolute: 1.5 10*3/uL (ref 0.7–3.1)
Lymphs: 27 %
MCH: 32.4 pg (ref 26.6–33.0)
MCHC: 35.6 g/dL (ref 31.5–35.7)
MCV: 91 fL (ref 79–97)
MONOS ABS: 0.5 10*3/uL (ref 0.1–0.9)
Monocytes: 9 %
NEUTROS PCT: 63 %
Neutrophils Absolute: 3.5 10*3/uL (ref 1.4–7.0)
PLATELETS: 243 10*3/uL (ref 150–379)
RBC: 4.84 x10E6/uL (ref 4.14–5.80)
RDW: 13.2 % (ref 12.3–15.4)
WBC: 5.6 10*3/uL (ref 3.4–10.8)

## 2016-03-23 LAB — COMPREHENSIVE METABOLIC PANEL
A/G RATIO: 2.1 (ref 1.2–2.2)
ALK PHOS: 69 IU/L (ref 39–117)
ALT: 16 IU/L (ref 0–44)
AST: 21 IU/L (ref 0–40)
Albumin: 4.7 g/dL (ref 3.5–5.5)
BUN/Creatinine Ratio: 13 (ref 9–20)
BUN: 13 mg/dL (ref 6–24)
Bilirubin Total: 0.6 mg/dL (ref 0.0–1.2)
CO2: 26 mmol/L (ref 18–29)
Calcium: 9.2 mg/dL (ref 8.7–10.2)
Chloride: 101 mmol/L (ref 96–106)
Creatinine, Ser: 0.98 mg/dL (ref 0.76–1.27)
GFR calc Af Amer: 109 mL/min/{1.73_m2} (ref >59)
GFR, EST NON AFRICAN AMERICAN: 94 mL/min/{1.73_m2} (ref >59)
GLOBULIN, TOTAL: 2.2 g/dL (ref 1.5–4.5)
Glucose: 91 mg/dL (ref 65–99)
POTASSIUM: 4.2 mmol/L (ref 3.5–5.2)
SODIUM: 143 mmol/L (ref 134–144)
Total Protein: 6.9 g/dL (ref 6.0–8.5)

## 2016-03-23 LAB — TSH: TSH: 2 u[IU]/mL (ref 0.450–4.500)

## 2016-03-23 LAB — LIPID PANEL W/O CHOL/HDL RATIO
Cholesterol, Total: 210 mg/dL — ABNORMAL HIGH (ref 100–199)
HDL: 69 mg/dL (ref >39)
LDL Calculated: 124 mg/dL — ABNORMAL HIGH (ref 0–99)
Triglycerides: 83 mg/dL (ref 0–149)
VLDL Cholesterol Cal: 17 mg/dL (ref 5–40)

## 2016-03-27 ENCOUNTER — Other Ambulatory Visit (INDEPENDENT_AMBULATORY_CARE_PROVIDER_SITE_OTHER): Payer: Self-pay

## 2016-03-27 DIAGNOSIS — Z1211 Encounter for screening for malignant neoplasm of colon: Secondary | ICD-10-CM

## 2016-03-27 LAB — POC HEMOCCULT BLD/STL (HOME/3-CARD/SCREEN)
Card #2 Fecal Occult Blod, POC: NEGATIVE
FECAL OCCULT BLD: NEGATIVE
FECAL OCCULT BLD: NEGATIVE

## 2016-05-24 ENCOUNTER — Other Ambulatory Visit: Payer: Self-pay | Admitting: Internal Medicine

## 2016-05-24 DIAGNOSIS — H547 Unspecified visual loss: Secondary | ICD-10-CM

## 2017-09-01 ENCOUNTER — Encounter: Payer: Self-pay | Admitting: Internal Medicine

## 2017-09-01 ENCOUNTER — Ambulatory Visit: Payer: Self-pay | Admitting: Internal Medicine

## 2017-09-01 VITALS — BP 130/90 | HR 64 | Resp 12 | Ht 65.0 in | Wt 175.0 lb

## 2017-09-01 DIAGNOSIS — M546 Pain in thoracic spine: Secondary | ICD-10-CM

## 2017-09-01 DIAGNOSIS — G8929 Other chronic pain: Secondary | ICD-10-CM

## 2017-09-01 DIAGNOSIS — Z23 Encounter for immunization: Secondary | ICD-10-CM

## 2017-09-01 DIAGNOSIS — R0789 Other chest pain: Secondary | ICD-10-CM

## 2017-09-01 NOTE — Progress Notes (Signed)
Subjective:    Patient ID: Joel SoursCarlos Mosqueda Todd, male    DOB: 05-06-1971, [age] y.o.   MRN: [id number]  HPI   Here after 1.5 year hiatus.  1.  Chest Pain:  Points to left mid sternal area.  First episodes was 2 months ago.  States has never had the pain when doing anything physically active, always occurs at rest.  Does not awaken from sleep.  Does not occur after a particularly strenuous day at work.   States it feels like a "pinching"  And does not radiate to jaw, abdomen, through to back or up to shoulder.  Does feel soreness over the rib/sternal junction at area of pain when he pushes there. Does get a "pinching"  Discomfort in his medial elbow and radial wrist separately when has the chest discomfort at times, but can have this discomfort without the chest discomfort.  He believes this started about 2 months ago as well. Chest discomfort lasts about 30 minutes when he has the discomfort.  He has these episodes maybe 3 times weekly. He stretches out when he has the pain, and feels a pull in that area and pain improves. Last episode 2 days ago. Has not taken any medication for the chest discomfort. No associated dyspnea. 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  He landed on his left lateral chest against the hood of his truck and has had pain in his thorax since then.  He did not seek care for this.  Had significant pain for 1 month, which has significantly improved, but continues to have some chronic issues, especially when lying down trying to sleep. Sleeps on his abdomen with his arms extended up over his head, palm down.  States his ulnar fingers fall asleep in this position as well. History of elevated BP without diagnosis of hypertension. History of total cholesterol of 210 in July of 2017 with HDL of 69 and LDL of 124.   No history of smoking or tobacco use.   No history of diabetes or hyperglycemia. No family history of heart  disease. He is overweight with BMI of 29  No outpatient medications have been marked as taking for the 09/01/17 encounter (Office Visit) with Julieanne MansonMulberry, Annemarie Sebree, MD.    No Known Allergies    Review of Systems  No PND or orthopnea symptoms.  No LE edema.     Objective:   Physical Exam  NAD HEENT:  PERRL, EOMI, throat without injection. Neck:  Supple, No adenopathy, no thyromegaly Chest:  CTA.  Point tenderness just to left of midsternum over joint with rib in same area of pain.  No pain with AP and bilateral rib cage compression.  No crepitation of bony drop offs CV:  RRR with normal S1 and S2, No S3, S4 or murmur.  No carotid bruits, Carotid, radial and DP pulses normal and equal. Abd:  S, NT, No HSM or masses appreciated.  + BS LE:  No edema. Neuro:  + Tinels over bilateral ulnar nerves at medial epicondyle areas.   ECG shows NSR with nonspecific ST/T wave changes and insignificant Q waves in inferior leads.  This is unchanged from 2016 ECG.  Otherwise normal.     Assessment & Plan:  1.  Chest Pain:  Likely chest wall.  ECG, CXR.  Check FLP, CBC, CMP.  CPK and MB. ECG shows nonspecific inferior lead changes unchanged from 2016 ECG, otherwise normal To call  if worsening or more frequent chest discomfort.  2.  Chronic thorax pain:  CXR will perhaps show if any old bony injury.  Though generally improved from initial fall 2 years ago, sleep is disrupted due to pain.  [facility].  3.  HM:  Influenza vaccine.

## 2017-09-01 NOTE — Patient Instructions (Signed)
Ibuprofen 200 mg 2-4 tabs with meal twice daily for 2 weeks, then every 6 hours thereafter only as needed for chest wall pain

## 2017-09-02 LAB — LIPID PANEL W/O CHOL/HDL RATIO
Cholesterol, Total: 238 mg/dL — ABNORMAL HIGH (ref 100–199)
HDL: 60 mg/dL (ref >39)
LDL CALC: 156 mg/dL — AB (ref 0–99)
TRIGLYCERIDES: 111 mg/dL (ref 0–149)
VLDL Cholesterol Cal: 22 mg/dL (ref 5–40)

## 2017-09-02 LAB — COMPREHENSIVE METABOLIC PANEL
ALK PHOS: 72 IU/L (ref 39–117)
ALT: 17 IU/L (ref 0–44)
AST: 20 IU/L (ref 0–40)
Albumin/Globulin Ratio: 1.7 (ref 1.2–2.2)
Albumin: 4.5 g/dL (ref 3.5–5.5)
BUN/Creatinine Ratio: 19 (ref 9–20)
BUN: 19 mg/dL (ref 6–24)
Bilirubin Total: 0.5 mg/dL (ref 0.0–1.2)
CO2: 25 mmol/L (ref 20–29)
CREATININE: 0.98 mg/dL (ref 0.76–1.27)
Calcium: 9.2 mg/dL (ref 8.7–10.2)
Chloride: 101 mmol/L (ref 96–106)
GFR calc Af Amer: 107 mL/min/{1.73_m2} (ref >59)
GFR calc non Af Amer: 93 mL/min/{1.73_m2} (ref >59)
GLUCOSE: 98 mg/dL (ref 65–99)
Globulin, Total: 2.7 g/dL (ref 1.5–4.5)
Potassium: 4.2 mmol/L (ref 3.5–5.2)
SODIUM: 142 mmol/L (ref 134–144)
Total Protein: 7.2 g/dL (ref 6.0–8.5)

## 2017-09-02 LAB — CBC WITH DIFFERENTIAL/PLATELET
BASOS ABS: 0.1 10*3/uL (ref 0.0–0.2)
Basos: 1 %
EOS (ABSOLUTE): 0.1 10*3/uL (ref 0.0–0.4)
Eos: 1 %
Hematocrit: 45.7 % (ref 37.5–51.0)
Hemoglobin: 16.1 g/dL (ref 13.0–17.7)
IMMATURE GRANULOCYTES: 0 %
Immature Grans (Abs): 0 10*3/uL (ref 0.0–0.1)
LYMPHS ABS: 1.4 10*3/uL (ref 0.7–3.1)
Lymphs: 29 %
MCH: 32.1 pg (ref 26.6–33.0)
MCHC: 35.2 g/dL (ref 31.5–35.7)
MCV: 91 fL (ref 79–97)
MONOCYTES: 7 %
MONOS ABS: 0.3 10*3/uL (ref 0.1–0.9)
NEUTROS PCT: 62 %
Neutrophils Absolute: 3 10*3/uL (ref 1.4–7.0)
PLATELETS: 225 10*3/uL (ref 150–379)
RBC: 5.02 x10E6/uL (ref 4.14–5.80)
RDW: 13.6 % (ref 12.3–15.4)
WBC: 4.9 10*3/uL (ref 3.4–10.8)

## 2017-09-02 LAB — CK TOTAL AND CKMB (NOT AT ARMC): CK TOTAL: 66 U/L (ref 24–204)

## 2017-09-03 ENCOUNTER — Encounter: Payer: Self-pay | Admitting: Internal Medicine

## 2017-09-03 DIAGNOSIS — E7800 Pure hypercholesterolemia, unspecified: Secondary | ICD-10-CM

## 2017-09-03 HISTORY — DX: Pure hypercholesterolemia, unspecified: E78.00

## 2017-09-17 NOTE — Progress Notes (Signed)
Referral, demographics and OV notes faxed to [place] [facility]. Facility will contact patient and schedule appointment.

## 2017-10-02 ENCOUNTER — Telehealth: Payer: Self-pay | Admitting: Internal Medicine

## 2017-10-02 NOTE — Telephone Encounter (Signed)
Patient's wife was inquiring on husband X-ray referral that stated  was told will be receiving a call with appointment information. It was a misunderstanding; [facility] and [facility] fee [facility] agreed to pay fee for have it done in that way. Referral was printed out and hand it to Patient' wife with all information to go anytime [facility] and get it done there.

## 2017-10-07 ENCOUNTER — Ambulatory Visit (HOSPITAL_COMMUNITY)
Admission: RE | Admit: 2017-10-07 | Discharge: 2017-10-07 | Disposition: A | Discharge status: Home / Self Care | Payer: Self-pay | Source: Ambulatory Visit | Attending: Internal Medicine | Admitting: Internal Medicine

## 2017-10-07 DIAGNOSIS — R0789 Other chest pain: Secondary | ICD-10-CM

## 2017-12-01 ENCOUNTER — Encounter: Payer: Self-pay | Admitting: Internal Medicine

## 2017-12-01 ENCOUNTER — Ambulatory Visit: Payer: Self-pay | Admitting: Internal Medicine

## 2017-12-01 VITALS — BP 128/88 | HR 60 | Resp 12 | Ht 65.0 in | Wt 162.0 lb

## 2017-12-01 DIAGNOSIS — R0789 Other chest pain: Secondary | ICD-10-CM

## 2017-12-01 DIAGNOSIS — E782 Mixed hyperlipidemia: Secondary | ICD-10-CM

## 2017-12-01 DIAGNOSIS — Z9189 Other specified personal risk factors, not elsewhere classified: Secondary | ICD-10-CM

## 2017-12-01 DIAGNOSIS — Z0120 Encounter for dental examination and cleaning without abnormal findings: Secondary | ICD-10-CM

## 2017-12-01 NOTE — Progress Notes (Signed)
   Subjective:    Patient ID: Joel Todd, male    DOB: 02/13/1971, 47 y.o.   MRN: [id number]  HPI   1.  Chest wall pain:  Left thorax chest wall pain after fall from roof 2 years ago.  He did go to PT in [facility] and was given home exercises.  He has not really been performing them, but in general, his chest wall pain is better. He apparently also complained of left low thoracic back pain, which they worked with him on as well and for which he also has exercises he is not performing. Overall, better, however.  2.  Hyperlipidemia:  Stopped eating in restaurants and has lost 13 lbs.  He feels much better. Also, limiting his soda intake to only once weekly. 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  3.  History of left thyroid nodule.  Never received a referral for thyroid ultrasound when ordered 03/2016.  No neck or swallowing discomfort.  No outpatient medications have been marked as taking for the 12/01/17 encounter (Office Visit) with Julieanne MansonMulberry, Shaunta Oncale, MD.   No Known Allergies    Review of Systems     Objective:   Physical Exam  NAD Neck:  Supple, No adenopathy, no thyromegaly and unable to feel a nodule on left or right (previously felt on left) Chest:  CTA CV:  RRR without murmur or rub, radial pulses normal and equal. LE:  No edema.         Assessment & Plan:  1.  Chest wall pain and low back pain:  Doing well, but encouraged to get moving on regular exercises for both chest wall and back.  2.  Hyperlipidemia:  Likely improved with significant lifestyle change (diet).  Would like to wait until he has had these changes for a good 3 months, so to keep lab appt in May. Applauded his efforts--also on how he is positively affecting family and coworkers by getting them to make changes as well. BP better today as well.  3.  HM:  PSA with labs in May.  Had CPE 03/2016.  Will schedule for another in 1 year.  4.  Thyroid nodule:  Not able to feel  this today.  Hold on ultrasound at this time.

## 2018-01-05 ENCOUNTER — Other Ambulatory Visit: Payer: Self-pay

## 2018-01-05 ENCOUNTER — Emergency Department (HOSPITAL_COMMUNITY): Payer: Self-pay

## 2018-01-05 ENCOUNTER — Emergency Department (HOSPITAL_COMMUNITY)
Admission: EM | Admit: 2018-01-05 | Discharge: 2018-01-05 | Disposition: A | Discharge status: Home / Self Care | Payer: Self-pay | Attending: Emergency Medicine | Admitting: Emergency Medicine

## 2018-01-05 ENCOUNTER — Encounter (HOSPITAL_COMMUNITY): Payer: Self-pay | Admitting: Emergency Medicine

## 2018-01-05 DIAGNOSIS — R079 Chest pain, unspecified: Secondary | ICD-10-CM | POA: Insufficient documentation

## 2018-01-05 DIAGNOSIS — Z79899 Other long term (current) drug therapy: Secondary | ICD-10-CM | POA: Insufficient documentation

## 2018-01-05 DIAGNOSIS — F419 Anxiety disorder, unspecified: Secondary | ICD-10-CM | POA: Insufficient documentation

## 2018-01-05 LAB — BASIC METABOLIC PANEL
ANION GAP: 12 (ref 5–15)
BUN: 13 mg/dL (ref 6–20)
CHLORIDE: 102 mmol/L (ref 101–111)
CO2: 25 mmol/L (ref 22–32)
Calcium: 9.2 mg/dL (ref 8.9–10.3)
Creatinine, Ser: 0.91 mg/dL (ref 0.61–1.24)
GFR calc Af Amer: >60 mL/min (ref >60)
GFR calc non Af Amer: >60 mL/min (ref >60)
GLUCOSE: 106 mg/dL — AB (ref 65–99)
POTASSIUM: 3.5 mmol/L (ref 3.5–5.1)
Sodium: 139 mmol/L (ref 135–145)

## 2018-01-05 LAB — CBC
HEMATOCRIT: 46.4 % (ref 39.0–52.0)
HEMOGLOBIN: 17 g/dL (ref 13.0–17.0)
MCH: 32.6 pg (ref 26.0–34.0)
MCHC: 36.6 g/dL — AB (ref 30.0–36.0)
MCV: 89.1 fL (ref 78.0–100.0)
Platelets: 222 10*3/uL (ref 150–400)
RBC: 5.21 MIL/uL (ref 4.22–5.81)
RDW: 12.5 % (ref 11.5–15.5)
WBC: 6.8 10*3/uL (ref 4.0–10.5)

## 2018-01-05 LAB — I-STAT TROPONIN, ED
TROPONIN I, POC: 0 ng/mL (ref 0.00–0.08)
Troponin i, poc: 0 ng/mL (ref 0.00–0.08)

## 2018-01-05 NOTE — ED Provider Notes (Signed)
[facility] EMERGENCY DEPARTMENT Provider Note   CSN: [id number] Arrival date & time: 01/05/18  1353     History   Chief Complaint Chief Complaint  Patient presents with  . Chest Pain    HPI Joel Todd is a 47 y.o. male.  HPI   47 year old male presents today with complaints of shortness of pain.  Patient notes that over the last 5 days he has had several episodes of chest tightness, shortness of breath and anxiety.  He notes these last several minutes and then resolved completely.  He notes some very minor anterior chest pain.  He denies any pain with deep inspiration, denies any abdominal pain, diaphoresis, nausea or vomiting.  Patient denies any history of the same.  At the time of my evaluation patient is symptomatic with no acute complaints.  Patient denies any cardiac or pulmonary history including ACS, PE.  He denies any significant risk factors for either other than hyper lipidemia.  Patient notes that recently his brother-in-law passed away after a prolonged hospital stay which is caused significant anxiety.  Past Medical History:  Diagnosis Date  . Hypercholesterolemia 09/03/2017    Patient Active Problem List   Diagnosis Date Noted  . Hypercholesterolemia 09/03/2017  . Headache 10/18/2015  . Acne rosacea 10/18/2015  . Presbyopia 10/18/2015  . Elevated blood pressure reading without diagnosis of hypertension 10/18/2015    Past Surgical History:  Procedure Laterality Date  . APPENDECTOMY  2002        Home Medications    Prior to Admission medications   Medication Sig Start Date End Date Taking? Authorizing Provider  ibuprofen (ADVIL,MOTRIN) 200 MG tablet Take 1,000 mg by mouth every 6 (six) hours as needed for headache.   Yes [provider]  PRESCRIPTION MEDICATION Take 1 tablet by mouth once. Took medication to relax   Yes [provider]  metroNIDAZOLE (METROGEL) 1 % gel Apply small amount to face twice  daily Patient not taking: Reported on 09/01/2017 10/18/15   Julieanne Manson, MD    Family History Family History  Problem Relation Age of Onset  . Hypertension Mother   . Benign prostatic hyperplasia Father   . Hypertension Brother     Social History Social History   Tobacco Use  . Smoking status: Never Smoker  . Smokeless tobacco: Never Used  Substance Use Topics  . Alcohol use: Yes    Alcohol/week: 0.0 oz    Comment: once weekly-beer  . Drug use: No     Allergies   Patient has no known allergies.   Review of Systems Review of Systems  All other systems reviewed and are negative.    Physical Exam Updated Vital Signs BP (!) 129/93   Pulse 66   Temp 98 F (36.7 C) (Oral)   Resp 14   SpO2 100%   Physical Exam  Constitutional: He is oriented to person, place, and time. He appears well-developed and well-nourished.  HENT:  Head: Normocephalic and atraumatic.  Eyes: Pupils are equal, round, and reactive to light. Conjunctivae are normal. Right eye exhibits no discharge. Left eye exhibits no discharge. No scleral icterus.  Neck: Normal range of motion. No JVD present. No tracheal deviation present.  Pulmonary/Chest: Effort normal. No stridor.  Neurological: He is alert and oriented to person, place, and time. Coordination normal.  Psychiatric: He has a normal mood and affect. His behavior is normal. Judgment and thought content normal.  Nursing note and vitals reviewed.  ED Treatments / Results  Labs (all labs ordered are listed, but only abnormal results are displayed) Labs Reviewed  BASIC METABOLIC PANEL - Abnormal; Notable for the following components:      Result Value   Glucose, Bld 106 (*)    All other components within normal limits  CBC - Abnormal; Notable for the following components:   MCHC 36.6 (*)    All other components within normal limits  I-STAT TROPONIN, ED  I-STAT TROPONIN, ED    EKG None   ED ECG REPORT   Date:  01/05/2018  Rate: 88  Rhythm: normal sinus rhythm  QRS Axis: normal  Intervals: normal  ST/T Wave abnormalities: normal  Conduction Disutrbances:none  Narrative Interpretation:   Old EKG Reviewed: unchanged  I have personally reviewed the EKG tracing and agree with the computerized printout as noted.   Radiology Dg Chest 2 View  Result Date: 01/05/2018 CLINICAL DATA:  Five days of LEFT-sided chest pain, pain with breathing, pain radiating to LEFT shoulder EXAM: CHEST - 2 VIEW COMPARISON:  10/07/2017 FINDINGS: Normal heart size, mediastinal contours, and pulmonary vascularity. Lungs clear. No pleural effusion or pneumothorax. Bones unremarkable. IMPRESSION: Normal exam. Electronically Signed   By: Ulyses Southward M.D.   On: 01/05/2018 15:15    Procedures Procedures (including critical care time)  Medications Ordered in ED Medications - No data to display   Initial Impression / Assessment and Plan / ED Course  I have reviewed the triage vital signs and the nursing notes.  Pertinent labs & imaging results that were available during my care of the patient were reviewed by me and considered in my medical decision making (see chart for details).       Final Clinical Impressions(s) / ED Diagnoses   Final diagnoses:  Anxiety  Chest pain, unspecified type   Labs: Opponent x2, BMP, CBC  Imaging: DG chest 2 view the EKG  Consults:  Therapeutics:  Discharge Meds:   Assessment/Plan: 47 year old male presents today with likely anxiety related chest discomfort.  Patient notes a tightness in his chest that comes and goes, this comes out of the blue.  This is not associated with exercise, he is very low heart score, low suspicion for ACS dissection.  Patient has no significant risk factors or etiology that would indicate pulmonary embolism.  Patient is asymptomatic at the time of evaluation he has 2- troponins, no significant findings on his work-up here today that would indicate further  evaluation or management.  Patient will follow-up as an outpatient with his primary care he will return immediately if he develops any new or worsening signs or symptoms.  Patient verbalized understanding and agreement to today's plan had no further questions or concerns the time discharge.      ED Discharge Orders    None       Rosalio Loud 01/05/18 2158    Derwood Kaplan, MD 01/07/18 [phone]

## 2018-01-05 NOTE — Discharge Instructions (Addendum)
Please read attached information. If you experience any new or worsening signs or symptoms please return to the emergency room for evaluation. Please follow-up with your primary care provider or specialist as discussed. Please use medication prescribed only as directed and discontinue taking if you have any concerning signs or symptoms.   °

## 2018-01-05 NOTE — ED Triage Notes (Addendum)
Pt reports 5 days of left sided chest pain with pain with breathing that radiates into the left shoulder. Does not smoke, no recent travel. Breath sounds clear but diminished to left. Pt does endorse a dry cough. Pt was evaluated for chest pain by PCP in April.

## 2018-01-05 NOTE — ED Notes (Signed)
Patient Alert and oriented to baseline. Stable and ambulatory to baseline. Patient verbalized understanding of the discharge instructions.  Patient belongings were taken by the patient.   

## 2018-01-08 ENCOUNTER — Encounter: Payer: Self-pay | Admitting: Internal Medicine

## 2018-01-08 ENCOUNTER — Ambulatory Visit: Payer: Self-pay | Admitting: Internal Medicine

## 2018-01-08 ENCOUNTER — Other Ambulatory Visit: Payer: Self-pay

## 2018-01-08 VITALS — BP 120/84 | HR 72 | Resp 12 | Ht 65.0 in | Wt 162.0 lb

## 2018-01-08 DIAGNOSIS — Z125 Encounter for screening for malignant neoplasm of prostate: Secondary | ICD-10-CM

## 2018-01-08 DIAGNOSIS — J302 Other seasonal allergic rhinitis: Secondary | ICD-10-CM

## 2018-01-08 DIAGNOSIS — F439 Reaction to severe stress, unspecified: Secondary | ICD-10-CM

## 2018-01-08 DIAGNOSIS — E7800 Pure hypercholesterolemia, unspecified: Secondary | ICD-10-CM

## 2018-01-08 MED ORDER — FEXOFENADINE HCL 180 MG PO TABS: 180.0000 mg | ORAL_TABLET | Freq: Every day | ORAL | Status: DC

## 2018-01-08 NOTE — Progress Notes (Signed)
   Subjective:    Patient ID: Joel Todd: 10/29/1970, 47 y.o.   MRN: [id number]  HPI   Seen in ED on 01-05-2018 with chest pain.  Ruled out for MI with both ECG and enzymes.  CXR normal.   Cardiorespiratory exam was normal.  Felt to be having anxiety.  He states 4 days prior to chest pain, had a sensation of irritation like he had powder in his throat. Worsened over the ensuing days    Was coming back from another town and felt short of breath, ultimately developing some chest pain.  Was driving back from the funeral when this really worsened, thinking about things.  Last couple of days, has felt short winded like he has been running but has not actually been running.  Denies any symptoms of itchy watery eyes or nose, sneezing, etc.   He has been stressed recently.  Brother in Social worker in hospital for prolonged period of time recently and died last 2023/02/01.  He had been visiting him regularly for past month.  Last 2 weeks, with him all day.   He has not been able to talk with anyone other than his wife.   He also feels being on the road so much was more of his stress than anything else.    Current Meds  Medication Sig  . ibuprofen (ADVIL,MOTRIN) 200 MG tablet Take 1,000 mg by mouth every 6 (six) hours as needed for headache.    No Known Allergies     Review of Systems     Objective:   Physical Exam NAD HEENT:  PERRL, EOMI, conjunctivae without injection.  TMs pearly gray.  Posterior pharynx with cobbling.  Nasal mucosa red and mildly swollen with clear discharge. Neck:  Supple, No adenopathy Chest:  CTA CV:  RRR without murmur or rub, radial pulses normal and equal Abd:  S, NT, + BS       Assessment & Plan:  1.  Chest discomfort and shortness of breath:  No findings today and recent extensive evaluation in ED following death and funeral of brother in law.   If continues with these symptoms, would recommend speaking with Hospice or N. Knight, LCSW.     To call if continues  2.  Seasonal allergies:  Fexofenadine 180 mg once daily.  Again, to call if symptoms continue.

## 2018-01-09 LAB — LIPID PANEL W/O CHOL/HDL RATIO
Cholesterol, Total: 205 mg/dL — ABNORMAL HIGH (ref 100–199)
HDL: 62 mg/dL (ref >39)
LDL CALC: 126 mg/dL — AB (ref 0–99)
TRIGLYCERIDES: 85 mg/dL (ref 0–149)
VLDL Cholesterol Cal: 17 mg/dL (ref 5–40)

## 2018-01-09 LAB — PSA: PROSTATE SPECIFIC AG, SERUM: 1.5 ng/mL (ref 0.0–4.0)

## 2018-05-12 ENCOUNTER — Other Ambulatory Visit: Payer: Self-pay

## 2018-05-12 DIAGNOSIS — E782 Mixed hyperlipidemia: Secondary | ICD-10-CM

## 2018-05-13 LAB — LIPID PANEL W/O CHOL/HDL RATIO
CHOLESTEROL TOTAL: 216 mg/dL — AB (ref 100–199)
HDL: 71 mg/dL (ref >39)
LDL CALC: 132 mg/dL — AB (ref 0–99)
TRIGLYCERIDES: 67 mg/dL (ref 0–149)
VLDL Cholesterol Cal: 13 mg/dL (ref 5–40)

## 2020-06-30 ENCOUNTER — Other Ambulatory Visit: Payer: Self-pay

## 2020-06-30 DIAGNOSIS — Z20822 Contact with and (suspected) exposure to covid-19: Secondary | ICD-10-CM

## 2020-07-01 LAB — NOVEL CORONAVIRUS, NAA: SARS-CoV-2, NAA: NOT DETECTED

## 2020-07-01 LAB — SARS-COV-2, NAA 2 DAY TAT

## 2021-02-17 ENCOUNTER — Emergency Department (HOSPITAL_COMMUNITY)
Admission: EM | Admit: 2021-02-17 | Discharge: 2021-02-18 | Disposition: A | Discharge status: Home / Self Care | Payer: Self-pay | Attending: Emergency Medicine | Admitting: Emergency Medicine

## 2021-02-17 DIAGNOSIS — R42 Dizziness and giddiness: Secondary | ICD-10-CM | POA: Insufficient documentation

## 2021-02-17 NOTE — ED Provider Notes (Signed)
Emergency Medicine Provider Triage Evaluation Note  Joel Todd , a 50 y.o. male  was evaluated in triage.  Pt complains of who presents with concern for intermittent episodes of chest pain over the last months that occur when at rest but resolves with activity.  Additionally patient experienced episode of shortness of breath yesterday lasted several hours, and then today had episode of extreme lightheadedness this evening.  He states he had had 2 beers to drink, however felt he was about to pass out and had associated chest tightness and shortness of breath at that time.  No history of cardiac disease in the past.  No palpitations to report, no fevers or chills recently.  No cough.  History provided by the patient with the assistance of his daughter, Judeth Cornfield, translating via telephone.  Video interpreter as well as phone line interpreter is unable to be contacted for assistance.  Review of Systems  Positive: Chest pain, shortness of breath, lightheadedness. Negative: Palpitations, nausea, vomiting, diarrhea, blurry vision, double vision, syncope  Physical Exam  BP (!) 144/91 (BP Location: Left Arm)   Pulse 70   Temp 98 F (36.7 C)   Resp 18   SpO2 98%  Gen:   Awake, no distress   Resp:  Normal effort  MSK:   Moves extremities without difficulty  Other:  RRR, no M/R/G.  No lower extremity edema.  Lungs CTA B.  Medical Decision Making  Medically screening exam initiated at 11:56 PM.  Appropriate orders placed.  Sai Zinn was informed that the remainder of the evaluation will be completed by another provider, this initial triage assessment does not replace that evaluation, and the importance of remaining in the ED until their evaluation is complete.  This chart was dictated using voice recognition software, Dragon. Despite the best efforts of this provider to proofread and correct errors, errors may still occur which can change documentation meaning.]     Sherrilee Gilles 02/17/21 2358    Koleen Distance, MD 02/18/21 1231

## 2021-02-18 ENCOUNTER — Other Ambulatory Visit: Payer: Self-pay

## 2021-02-18 ENCOUNTER — Emergency Department (HOSPITAL_COMMUNITY): Payer: Self-pay

## 2021-02-18 ENCOUNTER — Encounter (HOSPITAL_COMMUNITY): Payer: Self-pay | Admitting: *Deleted

## 2021-02-18 LAB — CBC WITH DIFFERENTIAL/PLATELET
Abs Immature Granulocytes: 0.01 10*3/uL (ref 0.00–0.07)
Basophils Absolute: 0 10*3/uL (ref 0.0–0.1)
Basophils Relative: 1 %
Eosinophils Absolute: 0.1 10*3/uL (ref 0.0–0.5)
Eosinophils Relative: 2 %
HCT: 41.6 % (ref 39.0–52.0)
Hemoglobin: 14.8 g/dL (ref 13.0–17.0)
Immature Granulocytes: 0 %
Lymphocytes Relative: 26 %
Lymphs Abs: 1.9 10*3/uL (ref 0.7–4.0)
MCH: 32.5 pg (ref 26.0–34.0)
MCHC: 35.6 g/dL (ref 30.0–36.0)
MCV: 91.2 fL (ref 80.0–100.0)
Monocytes Absolute: 0.7 10*3/uL (ref 0.1–1.0)
Monocytes Relative: 10 %
Neutro Abs: 4.3 10*3/uL (ref 1.7–7.7)
Neutrophils Relative %: 61 %
Platelets: 224 10*3/uL (ref 150–400)
RBC: 4.56 MIL/uL (ref 4.22–5.81)
RDW: 12.3 % (ref 11.5–15.5)
WBC: 7.1 10*3/uL (ref 4.0–10.5)
nRBC: 0 % (ref 0.0–0.2)

## 2021-02-18 LAB — URINALYSIS, ROUTINE W REFLEX MICROSCOPIC
Bilirubin Urine: NEGATIVE
Glucose, UA: NEGATIVE mg/dL
Hgb urine dipstick: NEGATIVE
Ketones, ur: NEGATIVE mg/dL
Leukocytes,Ua: NEGATIVE
Nitrite: NEGATIVE
Protein, ur: NEGATIVE mg/dL
Specific Gravity, Urine: 1.014 (ref 1.005–1.030)
pH: 6 (ref 5.0–8.0)

## 2021-02-18 LAB — BASIC METABOLIC PANEL
Anion gap: 4 — ABNORMAL LOW (ref 5–15)
BUN: 17 mg/dL (ref 6–20)
CO2: 28 mmol/L (ref 22–32)
Calcium: 8.7 mg/dL — ABNORMAL LOW (ref 8.9–10.3)
Chloride: 106 mmol/L (ref 98–111)
Creatinine, Ser: 1.42 mg/dL — ABNORMAL HIGH (ref 0.61–1.24)
GFR, Estimated: >60 mL/min (ref >60)
Glucose, Bld: 102 mg/dL — ABNORMAL HIGH (ref 70–99)
Potassium: 3.5 mmol/L (ref 3.5–5.1)
Sodium: 138 mmol/L (ref 135–145)

## 2021-02-18 LAB — TROPONIN I (HIGH SENSITIVITY)
Troponin I (High Sensitivity): 3 ng/L (ref <18)
Troponin I (High Sensitivity): 6 ng/L (ref <18)

## 2021-02-18 MED ORDER — MECLIZINE HCL 25 MG PO TABS: 25.0000 mg | ORAL_TABLET | Freq: Three times a day (TID) | ORAL | 0 refills | Status: DC | PRN

## 2021-02-18 MED ORDER — SODIUM CHLORIDE 0.9 % IV BOLUS
1000.0000 mL | Freq: Once | INTRAVENOUS | Status: AC
  Administered 2021-02-18: 1000 mL via INTRAVENOUS

## 2021-02-18 MED ORDER — MECLIZINE HCL 25 MG PO TABS
25.0000 mg | ORAL_TABLET | Freq: Once | ORAL | Status: AC
Start: 2021-02-18 — End: 2021-02-18
  Administered 2021-02-18: 25 mg via ORAL
  Filled 2021-02-18: qty 1

## 2021-02-18 NOTE — Discharge Instructions (Addendum)
Please follow closely with primary care. You can take the meclizine every 8 hours as needed for dizziness. Return if you have more shortness of breath, or persistent dizziness that is not improved with sitting still.

## 2021-02-18 NOTE — ED Provider Notes (Signed)
[facility]GENC[facility] Provider Note   CSN: [id number] Arrival date & time: 02/17/21  2223     History Chief Complaint  Patient presents with   Dizziness    Joel Todd is a 50 y.o. male past medical history of hyperlipidemia, presenting to the emergency department for evaluation of dizziness.  Patient's presenting complaint differs from triage documentation where patient's daughter was used for interpretation over the phone.  Patient's history is clarified using medical translator service.  Patient reports feeling dizziness yesterday evening.  He states he was at a party and does admit to drinking 2 beers in the evening though did not feel intoxicated.  He was sitting in a chair and noticed that he was feeling specifically room spinning dizziness.  This was worse when he turned his head and better with sitting still.  He states when he stood up he felt as though he was going to lose his balance due to the dizziness.  This is confirmed with a translator that he did NOT feel lightheaded or near syncopal.  He states symptoms lasted about 10 minutes in duration and improved during car ride, however when he went to get out of the car here at the ED it returned briefly.  Currently he feels symptoms reproduced if he turns his head quickly.  He has never been diagnosed with vertigo in the past.  No recent illness.  No new medications or daily medications taken.  He does not have any associated nausea with the dizziness nor does he have chest pain or shortness of breath last night with this episode.  Additionally he reports an episode of feeling short of breath on Friday.  This lasted about 2 hours.  This also clarified with translator that he was not feeling chest pain or chest tightness with this.  He describes it as when he sat down his abdomen is pressing up on his lungs causing him to feel like it was difficult to take a deep breath.  This symptom has not  recurred since Friday.  The history is provided by the patient. A language interpreter was used.      Past Medical History:  Diagnosis Date   Hypercholesterolemia 09/03/2017    Patient Active Problem List   Diagnosis Date Noted   Hypercholesterolemia 09/03/2017   Headache 10/18/2015   Acne rosacea 10/18/2015   Presbyopia 10/18/2015   Elevated blood pressure reading without diagnosis of hypertension 10/18/2015    Past Surgical History:  Procedure Laterality Date   APPENDECTOMY  2002       Family History  Problem Relation Age of Onset   Hypertension Mother    Benign prostatic hyperplasia Father    Hypertension Brother     Social History   Tobacco Use   Smoking status: Never   Smokeless tobacco: Never  Substance Use Topics   Alcohol use: Yes    Alcohol/week: 0.0 standard drinks    Comment: once weekly-beer   Drug use: No    Home Medications Prior to Admission medications   Medication Sig Start Date End Date Taking? Authorizing Provider  meclizine (ANTIVERT) 25 MG tablet Take 1 tablet (25 mg total) by mouth 3 (three) times daily as needed for dizziness. 02/18/21  Yes Ezequiel Macauley, [place] N, PA-C  fexofenadine (ALLEGRA) 180 MG tablet Take 1 tablet (180 mg total) by mouth daily. 01/08/18   Julieanne MansonMulberry, Elizabeth, MD  ibuprofen (ADVIL,MOTRIN) 200 MG tablet Take 1,000 mg by mouth every 6 (six) hours as needed  for headache.    [provider]  metroNIDAZOLE (METROGEL) 1 % gel Apply small amount to face twice daily Patient not taking: Reported on 09/01/2017 10/18/15   Julieanne Manson, MD  PRESCRIPTION MEDICATION Take 1 tablet by mouth once. Took medication to relax    [provider]    Allergies    Patient has no known allergies.  Review of Systems   Review of Systems  Constitutional:  Negative for fever.  Eyes:  Negative for visual disturbance.  Respiratory:  Positive for shortness of breath. Negative for cough and chest tightness.   Cardiovascular:   Negative for chest pain, palpitations and leg swelling.  Neurological:  Positive for dizziness. Negative for syncope, facial asymmetry, speech difficulty, light-headedness, numbness and headaches.  All other systems reviewed and are negative.  Physical Exam Updated Vital Signs BP (!) 130/98   Pulse (!) 53   Temp 97.6 F (36.4 C) (Oral)   Resp 12   Ht 5\' 5"  (1.651 m)   Wt 73.5 kg   SpO2 100%   BMI 26.96 kg/m   Physical Exam Vitals and nursing note reviewed.  Constitutional:      General: He is not in acute distress.    Appearance: He is well-developed. He is not ill-appearing.  HENT:     Head: Normocephalic and atraumatic.  Eyes:     Extraocular Movements: Extraocular movements intact.     Conjunctiva/sclera: Conjunctivae normal.     Pupils: Pupils are equal, round, and reactive to light.  Cardiovascular:     Rate and Rhythm: Normal rate and regular rhythm.     Pulses: Normal pulses.     Heart sounds: Normal heart sounds.  Pulmonary:     Effort: Pulmonary effort is normal. No respiratory distress.     Breath sounds: Normal breath sounds.  Abdominal:     General: Bowel sounds are normal.     Palpations: Abdomen is soft.     Tenderness: There is no abdominal tenderness.  Musculoskeletal:     Cervical back: Normal range of motion and neck supple.     Right lower leg: No edema.     Left lower leg: No edema.  Skin:    General: Skin is warm.  Neurological:     Mental Status: He is alert.     Comments: Mental Status:  Alert, oriented, thought content appropriate, able to give a coherent history. Speech fluent without evidence of aphasia. Able to follow 2 step commands without difficulty.  Cranial Nerves:  II:  pupils equal, round, reactive to light III,IV, VI: ptosis not present, extra-ocular motions intact bilaterally  V,VII: smile symmetric, facial light touch sensation equal VIII: hearing grossly normal to voice  X: uvula elevates symmetrically  XI: bilateral  shoulder shrug symmetric and strong XII: midline tongue extension without fassiculations Motor:  Normal tone. 5/5 strength in upper and lower extremities bilaterally including strong and equal grip strength and dorsiflexion/plantar flexion Sensory: grossly normal in all extremities.  Cerebellar: normal finger-to-nose with bilateral upper extremities Gait: normal gait and balance CV: distal pulses palpable throughout     Psychiatric:        Behavior: Behavior normal.    ED Results / Procedures / Treatments   Labs (all labs ordered are listed, but only abnormal results are displayed) Labs Reviewed  BASIC METABOLIC PANEL - Abnormal; Notable for the following components:      Result Value   Glucose, Bld 102 (*)    Creatinine, Ser 1.42 (*)  Calcium 8.7 (*)    Anion gap 4 (*)    All other components within normal limits  CBC WITH DIFFERENTIAL/PLATELET  URINALYSIS, ROUTINE W REFLEX MICROSCOPIC  TROPONIN I (HIGH SENSITIVITY)  TROPONIN I (HIGH SENSITIVITY)    EKG EKG Interpretation  Date/Time:  Sunday February 18 2021 00:05:31 EDT Ventricular Rate:  63 PR Interval:  146 QRS Duration: 90 QT Interval:  402 QTC Calculation: 411 R Axis:   92 Text Interpretation: Normal sinus rhythm Rightward axis Borderline ECG No significant change since last tracing Confirmed by Linwood Dibbles [id number]) on 02/18/2021 8:31:26 AM  Radiology DG Chest 2 View  Result Date: 02/18/2021 CLINICAL DATA:  Shortness of breath EXAM: CHEST - 2 VIEW COMPARISON:  01/05/2018 FINDINGS: The heart size and mediastinal contours are within normal limits. Both lungs are clear. The visualized skeletal structures are unremarkable. IMPRESSION: No active cardiopulmonary disease. Electronically Signed   By: Deatra Moyses Pavey M.D.   On: 02/18/2021 00:51    Procedures Procedures   Medications Ordered in ED Medications  sodium chloride 0.9 % bolus 1,000 mL (0 mLs Intravenous Stopped 02/18/21 0953)  meclizine (ANTIVERT) tablet 25 mg  (25 mg Oral Given 02/18/21 0732)    ED Course  I have reviewed the triage vital signs and the nursing notes.  Pertinent labs & imaging results that were available during my care of the patient were reviewed by me and considered in my medical decision making (see chart for details).  Clinical Course as of 02/18/21 0959  Wynelle Link Feb 18, 2021  5732 Patient with improvement in sx, ambulating without recurrence of dizziness. Discussed presentation and workup with attending physician Dr. Roselyn Bering, who is in agreement with disposition for discharge.  [JR]    Clinical Course User Index [JR] Kailand Seda, [place] N, PA-C   MDM Rules/Calculators/A&P                          Patient is a 50 year old male with history of hyperlipidemia, presenting for evaluation of room spinning dizziness that occurred yesterday evening.  This dizziness is brought on by quick movements of his head and is better with rest.  Patient's presentation and history is clarified using interpreter service, and does differ from triage documentation.  However his report is specifically clarified using medical interpreter.  Patient has not having any chest tightness or shortness of breath with the dizziness.  He had a separate episode of about 2 hours in length where he felt difficult to take a deep breath on Friday though this resolved and has not recurred.   Presenting symptoms today seem most consistent with a peripheral vertigo.  It is brought on by quick movements of his head and improved with sitting still.  He has no neurodeficits or ataxia on exam.  His ECG is nonischemic.  Troponins were obtained in triage and are negative.  CBC without abnormality, UA is negative.  Metabolic panel with slightly elevated creatinine to 1.4.  Unclear baseline, last documented creatinine was 3 years ago.  No other significant electrolyte derangements.  Chest x-ray was also obtained in triage and is negative.  Patient is treated with meclizine and IV fluids  and has resolution in symptoms.  He is ambulating without recurrent dizziness.  Do not believe advanced imaging of the head is indicated at this time, presentation does not seem consistent with a central cause of vertigo.  Discussed patient presentation, workup and care plan with attending Dr. Roselyn Bering, who is  in agreement.   Patient be discharged with prescription for meclizine, instructions to follow closely with PCP.  Return precautions discussed, including persistent dizziness, new onset of chest pain, recurrent or worsening shortness of breath, or other concerning symptoms  Final Clinical Impression(s) / ED Diagnoses Final diagnoses:  Dizziness    Rx / DC Orders ED Discharge Orders          Ordered    meclizine (ANTIVERT) 25 MG tablet  3 times daily PRN        02/18/21 0940             Conda Wannamaker, [place] N, PA-C 02/18/21 6384    Linwood Dibbles, MD 02/20/21 [phone]

## 2021-02-18 NOTE — ED Triage Notes (Signed)
Chest pain and feeling litheaded dizziness for one week

## 2022-03-28 ENCOUNTER — Encounter: Payer: Self-pay | Admitting: Internal Medicine

## 2022-03-28 ENCOUNTER — Ambulatory Visit: Payer: Self-pay | Admitting: Internal Medicine

## 2022-03-28 VITALS — BP 120/82 | HR 60 | Resp 12 | Ht 65.25 in | Wt 169.0 lb

## 2022-03-28 DIAGNOSIS — R52 Pain, unspecified: Secondary | ICD-10-CM

## 2022-03-28 DIAGNOSIS — E7800 Pure hypercholesterolemia, unspecified: Secondary | ICD-10-CM

## 2022-03-28 DIAGNOSIS — H04123 Dry eye syndrome of bilateral lacrimal glands: Secondary | ICD-10-CM

## 2022-03-28 DIAGNOSIS — H029 Unspecified disorder of eyelid: Secondary | ICD-10-CM

## 2022-03-28 DIAGNOSIS — R519 Headache, unspecified: Secondary | ICD-10-CM

## 2022-03-28 DIAGNOSIS — F419 Anxiety disorder, unspecified: Secondary | ICD-10-CM | POA: Insufficient documentation

## 2022-03-28 NOTE — Progress Notes (Signed)
Subjective:    Patient ID: Joel Todd, male   DOB: 12-Feb-1971, 51 y.o.   MRN: [id number]   HPI  Here to re establish  Joel Todd interprets  Bilateral eye dryness.  May have some itching of eyes at night.  At times a bit red.   Has not tried and eyedrops or other medications for this.  Noted these symptoms at same time the bump on his upper eyelid was noted 2 weeks ago.    Has a red bump on his upper left eyelid he noted for first time 2 weeks ago.  Has not changed over time.  Has a mild foreign body sensation with the lesion on inside of upper eyelid.   Denies sneezing, itchy water nose or throat.  He works Holiday representative, but does not note worsening of dry eyes and other symptoms with work. Does not recall FB flying into eye.  2.  Insomnia:  Problems in the past as well.  Dreams he is in a small space.  Has to get up and calm himself to know this is just a dream.  Has been dealing with this for a month.  Currently, is feeling anxious when he lies down to sleep and starts to feel claustrophobic despite not even falling asleep and dreaming.  Has to get up and move around.  His wife, who accompanies him, states this is starting to affect him in other situations as well--in a car or room.  Appears desperate to his wife--as he is panicking.  + rapid heartbeat for patient and appears he is breathing harder and complains of headache.    3.  Hurts all over intermittently:  Has been a problem for a month.  Occurs maybe once weekly and can last for 3 days.   He is stressed about buying some land and building a home.  Is getting complicated and the uncertainty of what happens next is causing him stress.  Hearing different recommendations from a realtor and family member who does construction as to what should be done.  Hard to know who to believe.       No outpatient medications have been marked as taking for the 03/28/22 encounter (Office Visit) with Julieanne Manson, MD.   No  Known Allergies  Past Medical History:  Diagnosis Date   Allergic conjunctivitis    spring   Hypercholesterolemia 09/03/2017   Past Surgical History:  Procedure Laterality Date   APPENDECTOMY  2002   Family History  Problem Relation Age of Onset   Hypertension Mother    Benign prostatic hyperplasia Father    Hypertension Brother    Social History   Tobacco Use   Smoking status: Never    Passive exposure: Never   Smokeless tobacco: Never  Vaping Use   Vaping Use: Never used  Substance Use Topics   Alcohol use: Yes    Comment: once weekly-beer.  Sometimes more on weekend with a party   Drug use: No        Review of Systems    Objective:   BP 120/82 (BP Location: Right Arm, Patient Position: Sitting, Cuff Size: Normal)   Pulse 60   Resp 12   Ht 5' 5.25" (1.657 m)   Wt 169 lb (76.7 kg)   BMI 27.91 kg/m   Physical Exam NAD HEENT:  PERRL, EOMI, Left upper eyelid with soft tissue swelling on external surface and pointing without pustule on conjunctival surface.  Conjunctivae mildly injected and with decreased tears.  Eyelid margins appear flaky and mildly injected. Discs sharp.  TMs pearly gray, throat without injection. Neck:  Supple, No adenopathy, no thyromegaly Chest:  CTA CV:  RRR with normal S1 and S2, No S3, S4  or murmur.  No carotid bruits.  Carotid, radial and DP pulse normal and equal Abd:  S, NT, No HSM or mass, + BS LE:  No edema NT on palpation of legs/arms.  No erythema or swelling of any joint.   Assessment & Plan   Eye complaints:  appears to have blepharitis and perhaps a chalazion.  Warm pack the eyelid for 20 mintues 2-3 times daily.  Warm water with Johnsons baby shampoo to clean eyelid margins twice daily, then rinse with cool water.  Lubricating eye drops as well as needed.  2.  Generalized pain/HAs:  CBC, CMP, Sed Rate, TSH.  Perhaps related to anxiety with current stress level.  3.  Insomnia/anxiety/panic attacks:  again, likely  related to what appears to be overwhelming stress:  case management with Morene Antu and encouraged him to walk in to [facility] of the [place] for prioritizing issues and counseling as well.  4.  Hypercholesterolemia:  FLP.

## 2022-03-28 NOTE — Patient Instructions (Addendum)
Lubricating eye drops Johnson's no more tears baby shampoo--1-2 drops in 1 cup warm water and wash eyelids twice daily.  [facility]  Address: [address] [place], [place], [place] [address]  Phone: [phone] Please walk into the clinic between 9 a.m. and 1 p.m. Mon-Fri to establish Virtual visits are available

## 2022-03-29 LAB — COMPREHENSIVE METABOLIC PANEL
ALT: 19 IU/L (ref 0–44)
AST: 20 IU/L (ref 0–40)
Albumin/Globulin Ratio: 2.1 (ref 1.2–2.2)
Albumin: 4.8 g/dL (ref 4.1–5.1)
Alkaline Phosphatase: 77 IU/L (ref 44–121)
BUN/Creatinine Ratio: 17 (ref 9–20)
BUN: 15 mg/dL (ref 6–24)
Bilirubin Total: 0.7 mg/dL (ref 0.0–1.2)
CO2: 24 mmol/L (ref 20–29)
Calcium: 9.9 mg/dL (ref 8.7–10.2)
Chloride: 101 mmol/L (ref 96–106)
Creatinine, Ser: 0.9 mg/dL (ref 0.76–1.27)
Globulin, Total: 2.3 g/dL (ref 1.5–4.5)
Glucose: 90 mg/dL (ref 70–99)
Potassium: 4.2 mmol/L (ref 3.5–5.2)
Sodium: 138 mmol/L (ref 134–144)
Total Protein: 7.1 g/dL (ref 6.0–8.5)
eGFR: 105 mL/min/{1.73_m2} (ref >59)

## 2022-03-29 LAB — CBC WITH DIFFERENTIAL/PLATELET
Basophils Absolute: 0 10*3/uL (ref 0.0–0.2)
Basos: 1 %
EOS (ABSOLUTE): 0.1 10*3/uL (ref 0.0–0.4)
Eos: 1 %
Hematocrit: 44.2 % (ref 37.5–51.0)
Hemoglobin: 15.4 g/dL (ref 13.0–17.7)
Immature Grans (Abs): 0 10*3/uL (ref 0.0–0.1)
Immature Granulocytes: 0 %
Lymphocytes Absolute: 1.4 10*3/uL (ref 0.7–3.1)
Lymphs: 28 %
MCH: 31.6 pg (ref 26.6–33.0)
MCHC: 34.8 g/dL (ref 31.5–35.7)
MCV: 91 fL (ref 79–97)
Monocytes Absolute: 0.4 10*3/uL (ref 0.1–0.9)
Monocytes: 7 %
Neutrophils Absolute: 3.2 10*3/uL (ref 1.4–7.0)
Neutrophils: 63 %
Platelets: 296 10*3/uL (ref 150–450)
RBC: 4.88 x10E6/uL (ref 4.14–5.80)
RDW: 12.4 % (ref 11.6–15.4)
WBC: 5.1 10*3/uL (ref 3.4–10.8)

## 2022-03-29 LAB — SEDIMENTATION RATE: Sed Rate: 2 mm/hr (ref 0–15)

## 2022-03-29 LAB — LIPID PANEL W/O CHOL/HDL RATIO
Cholesterol, Total: 227 mg/dL — ABNORMAL HIGH (ref 100–199)
HDL: 64 mg/dL (ref >39)
LDL Chol Calc (NIH): 151 mg/dL — ABNORMAL HIGH (ref 0–99)
Triglycerides: 69 mg/dL (ref 0–149)
VLDL Cholesterol Cal: 12 mg/dL (ref 5–40)

## 2022-03-29 LAB — TSH: TSH: 1.78 u[IU]/mL (ref 0.450–4.500)

## 2022-08-05 ENCOUNTER — Ambulatory Visit: Payer: Self-pay | Admitting: Internal Medicine

## 2022-08-05 ENCOUNTER — Encounter: Payer: Self-pay | Admitting: Internal Medicine

## 2022-08-05 VITALS — BP 130/88 | HR 64 | Resp 12 | Ht 65.0 in | Wt 173.0 lb

## 2022-08-05 DIAGNOSIS — Z125 Encounter for screening for malignant neoplasm of prostate: Secondary | ICD-10-CM

## 2022-08-05 DIAGNOSIS — H1010 Acute atopic conjunctivitis, unspecified eye: Secondary | ICD-10-CM | POA: Insufficient documentation

## 2022-08-05 DIAGNOSIS — K625 Hemorrhage of anus and rectum: Secondary | ICD-10-CM

## 2022-08-05 DIAGNOSIS — K5900 Constipation, unspecified: Secondary | ICD-10-CM

## 2022-08-05 DIAGNOSIS — E7800 Pure hypercholesterolemia, unspecified: Secondary | ICD-10-CM

## 2022-08-05 DIAGNOSIS — Z0000 Encounter for general adult medical examination without abnormal findings: Secondary | ICD-10-CM

## 2022-08-05 MED ORDER — PREPARATION H 0.25 % RE SUPP: 1.0000 | Freq: Two times a day (BID) | RECTAL | Status: DC

## 2022-08-05 MED ORDER — METAMUCIL SMOOTH TEXTURE 58.6 % PO POWD: 1.0000 | Freq: Every day | ORAL | 12 refills | Status: DC

## 2022-08-05 NOTE — Progress Notes (Signed)
Subjective:    Patient ID: Joel Todd, male   DOB: 1970-11-01, 51 y.o.   MRN: [id number]   HPI  Here for Male CPE:  1.  STE:  Does not perform.  No family history of testicular cancer.    2.  PSA:  Last checked in 2019 and normal.  No family history of prostate cancer.    3.  Guaiac Cards/FIT:  Last checked 2017 and negative.  4.  Colonoscopy:  Never.  No family history of colon cancer.    5.  Cholesterol/Glucose:  Cholesterol gradually increasing with LDL portion mainly increasing with total.  HDL remains good, but decreased at 64 earlier in year.  Eats a lot of fast food.  Wife could make him a lunch.  Blood glucose fine in past   6.  Immunizations: Has not had influenza or COVID booster.  Not interested in both.   Immunization History  Administered Date(s) Administered   Influenza Inj Mdck Quad Pf 09/01/2017   Influenza Split 09/29/2015   Tdap 03/22/2016     No outpatient medications have been marked as taking for the 08/05/22 encounter (Office Visit) with Julieanne Manson, MD.   No Known Allergies  Past Medical History:  Diagnosis Date   Allergic conjunctivitis    spring   Hypercholesterolemia 09/03/2017   Past Surgical History:  Procedure Laterality Date   APPENDECTOMY  2002   Family History  Problem Relation Age of Onset   Hypertension Mother    Benign prostatic hyperplasia Father    Hypertension Brother    Social History   Socioeconomic History   Marital status: Married    Spouse name: Doralee Albino   Number of children: 4   Years of education: 9   Highest education level: Not on file  Occupational History   Occupation: Retail banker interior   Occupation: construction/remodeling  Tobacco Use   Smoking status: Never    Passive exposure: Never   Smokeless tobacco: Never  Vaping Use   Vaping Use: Never used  Substance and Sexual Activity   Alcohol use: Yes    Comment: once weekly-beer.  Sometimes more on weekend with a  party   Drug use: No   Sexual activity: Yes    Partners: Female    Birth control/protection: Surgical    Comment: Wife had tubal ligation  Other Topics Concern   Not on file  Social History Narrative   Originally from [place]   Came to [employer]. In 1989   Lives with his wife and 4 daughters       Social Determinants of Health   Financial Resource Strain: Low Risk  (08/05/2022)   Overall Financial Resource Strain (CARDIA)    Difficulty of Paying Living Expenses: Not hard at all  Food Insecurity: No Food Insecurity (08/05/2022)   Hunger Vital Sign    Worried About Running Out of Food in the Last Year: Never true    Ran Out of Food in the Last Year: Never true  Transportation Needs: No Transportation Needs (08/05/2022)   PRAPARE - Administrator, Civil Service (Medical): No    Lack of Transportation (Non-Medical): No  Physical Activity: Not on file  Stress: Not on file  Social Connections: Not on file  Intimate Partner Violence: Not At Risk (08/05/2022)   Humiliation, Afraid, Rape, and Kick questionnaire    Fear of Current or Ex-Partner: No    Emotionally Abused: No    Physically Abused: No  Sexually Abused: No       Review of Systems  Respiratory:  Negative for shortness of breath.   Cardiovascular:  Negative for chest pain, palpitations and leg swelling.  Gastrointestinal:  Positive for rectal pain (Past month with wiping and will have small amount of blood on tissue.  No blood on or in stool.  Had similar episode 8 years ago and used Prep H supp with good outcome.).      Objective:   BP 130/88 (BP Location: Right Arm, Patient Position: Sitting, Cuff Size: Normal)   Pulse 64   Resp 12   Ht 5\' 5"  (1.651 m)   Wt 173 lb (78.5 kg)   BMI 28.79 kg/m   Physical Exam HENT:     Head: Normocephalic and atraumatic.     Right Ear: Tympanic membrane, ear canal and external ear normal.     Left Ear: Tympanic membrane, ear canal and external ear normal.     Nose:  Nose normal.     Mouth/Throat:     Mouth: Mucous membranes are moist.     Pharynx: Oropharynx is clear.  Eyes:     Extraocular Movements: Extraocular movements intact.     Pupils: Pupils are equal, round, and reactive to light.     Comments: Discs sharp  Neck:     Thyroid: No thyroid mass or thyromegaly.  Cardiovascular:     Rate and Rhythm: Normal rate and regular rhythm.     Heart sounds: S1 normal and S2 normal. No murmur heard.    No friction rub. No S3 or S4 sounds.     Comments: No carotid bruits.  Carotid, radial, femoral, DP and PT pulses normal and equal.   Pulmonary:     Effort: Pulmonary effort is normal.     Breath sounds: Normal breath sounds and air entry.  Abdominal:     General: Bowel sounds are normal.     Palpations: Abdomen is soft. There is no hepatomegaly, splenomegaly or mass.     Tenderness: There is no abdominal tenderness.     Hernia: No hernia is present.  Genitourinary:    Penis: Uncircumcised.      Testes:        Right: Mass or tenderness not present. Right testis is descended.        Left: Mass or tenderness not present. Left testis is descended.  Musculoskeletal:        General: Normal range of motion.     Cervical back: Normal range of motion and neck supple.     Right lower leg: No edema.     Left lower leg: No edema.  Lymphadenopathy:     Head:     Right side of head: No submental or submandibular adenopathy.     Left side of head: No submental or submandibular adenopathy.     Cervical: No cervical adenopathy.     Upper Body:     Right upper body: No supraclavicular adenopathy.     Left upper body: No supraclavicular adenopathy.     Lower Body: No left inguinal adenopathy.  Skin:    General: Skin is warm.     Capillary Refill: Capillary refill takes less than 2 seconds.     Findings: No rash.  Neurological:     General: No focal deficit present.     Mental Status: He is alert and oriented to person, place, and time.     Cranial  Nerves: Cranial nerves 2-12 are intact.  Sensory: Sensation is intact.     Motor: Motor function is intact.     Coordination: Coordination is intact.     Gait: Gait is intact.     Deep Tendon Reflexes: Reflexes are normal and symmetric.  Psychiatric:        Mood and Affect: Mood normal.        Speech: Speech normal.        Behavior: Behavior normal. Behavior is cooperative.   Anoscopy:  no bleeding or lesion.     Assessment & Plan    CPE PSA Encouraged influenza and COvID vaccines  2.  Rectal bleeding and constipation:  Metamucil with increase water intake daily to soften stools.  Tucks to pat area dry after BM and then insert prep H supp twice daily.  Call if does not improve.  3.  Hypercholesterolemia with high HDL.  Would like to see LDL lower, however.  To work on diet and physical activity.  Repeat FLP in 6 months.

## 2022-08-05 NOTE — Patient Instructions (Signed)
Call if the bleeding with bowel movements does not resolve

## 2022-08-06 LAB — PSA: Prostate Specific Ag, Serum: 1.3 ng/mL (ref 0.0–4.0)

## 2023-02-04 ENCOUNTER — Other Ambulatory Visit: Payer: Self-pay | Admitting: Internal Medicine

## 2023-02-04 ENCOUNTER — Other Ambulatory Visit: Payer: Self-pay

## 2023-02-04 DIAGNOSIS — E7800 Pure hypercholesterolemia, unspecified: Secondary | ICD-10-CM

## 2023-02-05 LAB — LIPID PANEL W/O CHOL/HDL RATIO
Cholesterol, Total: 228 mg/dL — ABNORMAL HIGH (ref 100–199)
HDL: 62 mg/dL (ref >39)
LDL Chol Calc (NIH): 147 mg/dL — ABNORMAL HIGH (ref 0–99)
Triglycerides: 110 mg/dL (ref 0–149)
VLDL Cholesterol Cal: 19 mg/dL (ref 5–40)

## 2023-02-06 IMAGING — CR DG CHEST 2V
2 series · 2 of 2 positions shown · non-contrast
Comparison: 01/05/2018

CLINICAL DATA: Shortness of breath

EXAM:
CHEST - 2 VIEW

[chest pa]
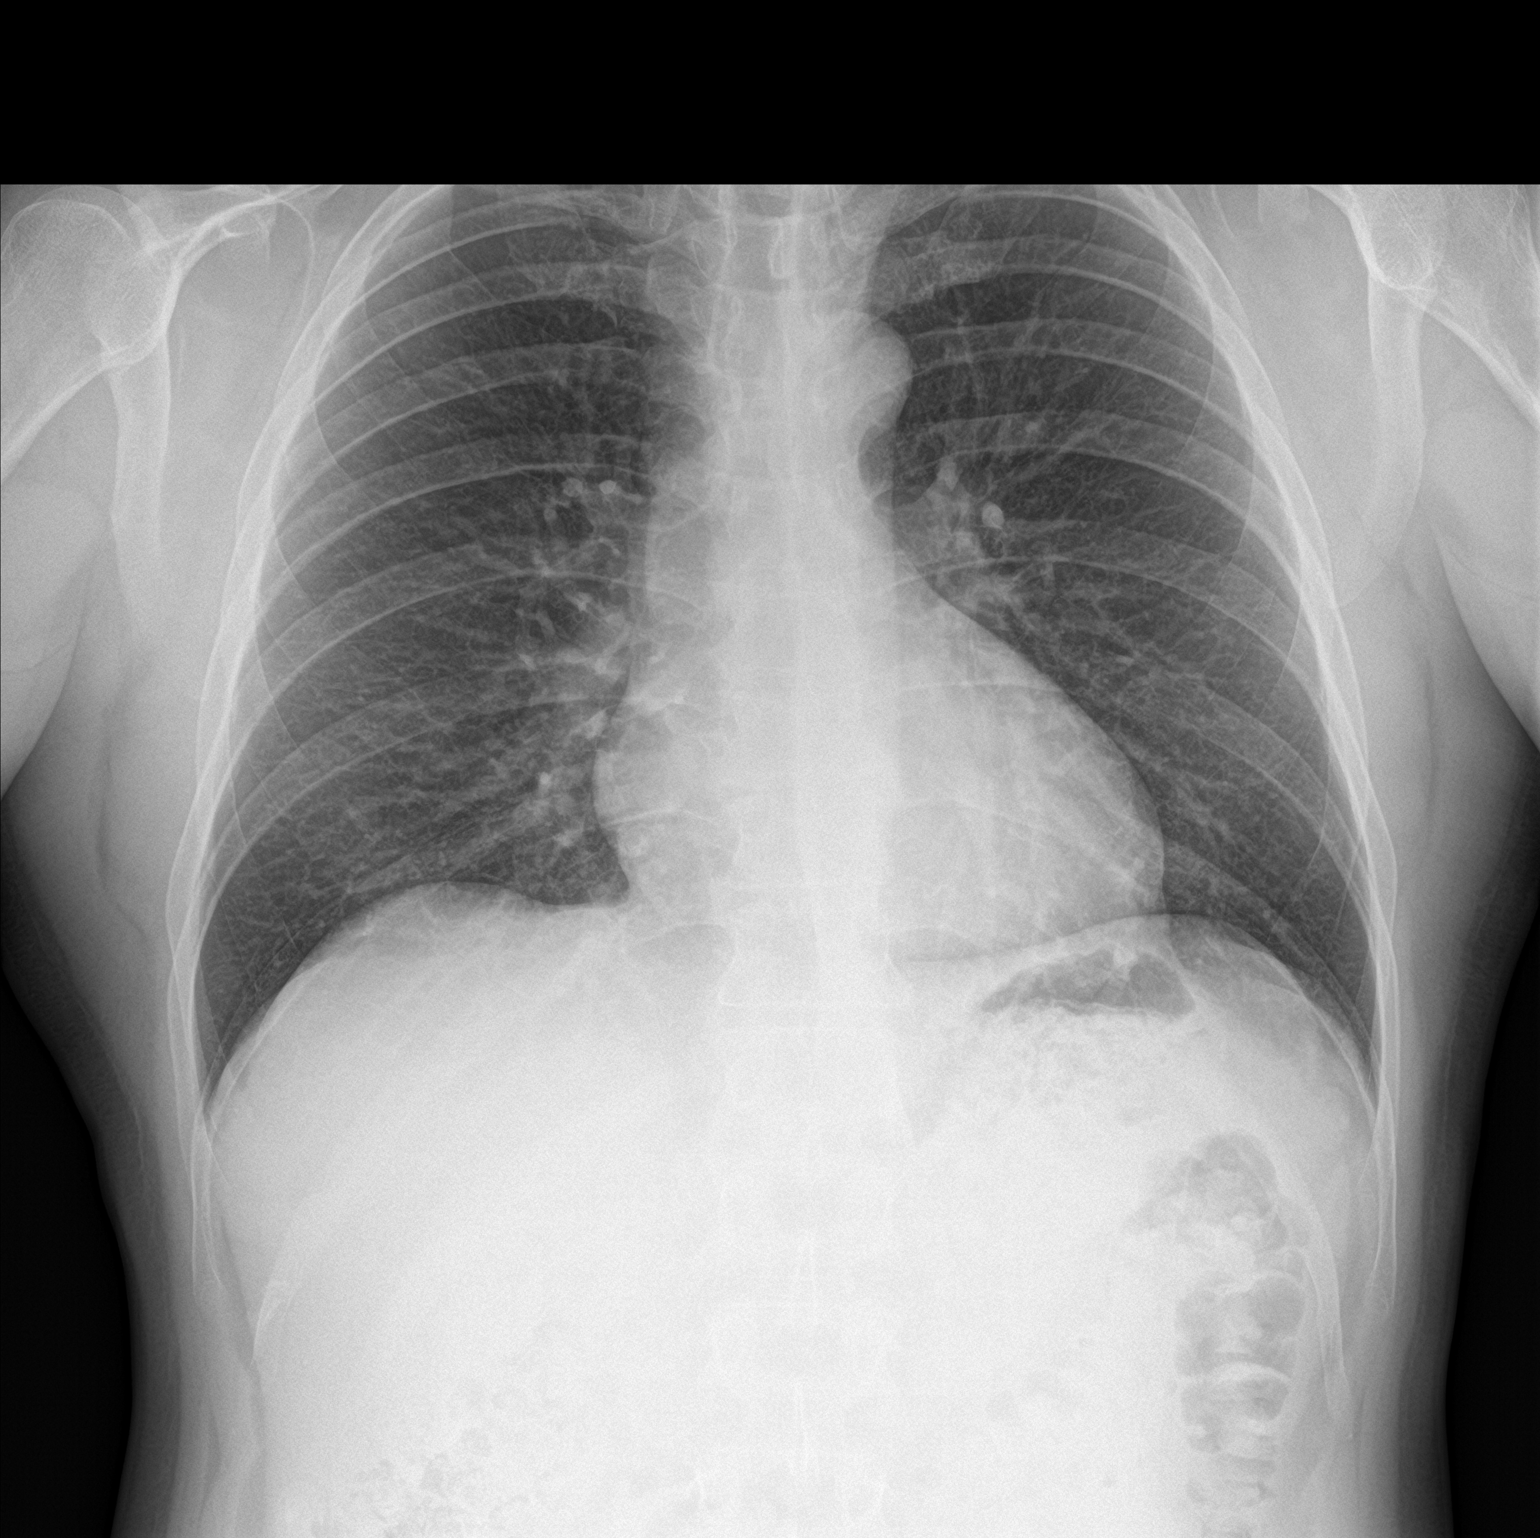

[chest lat]
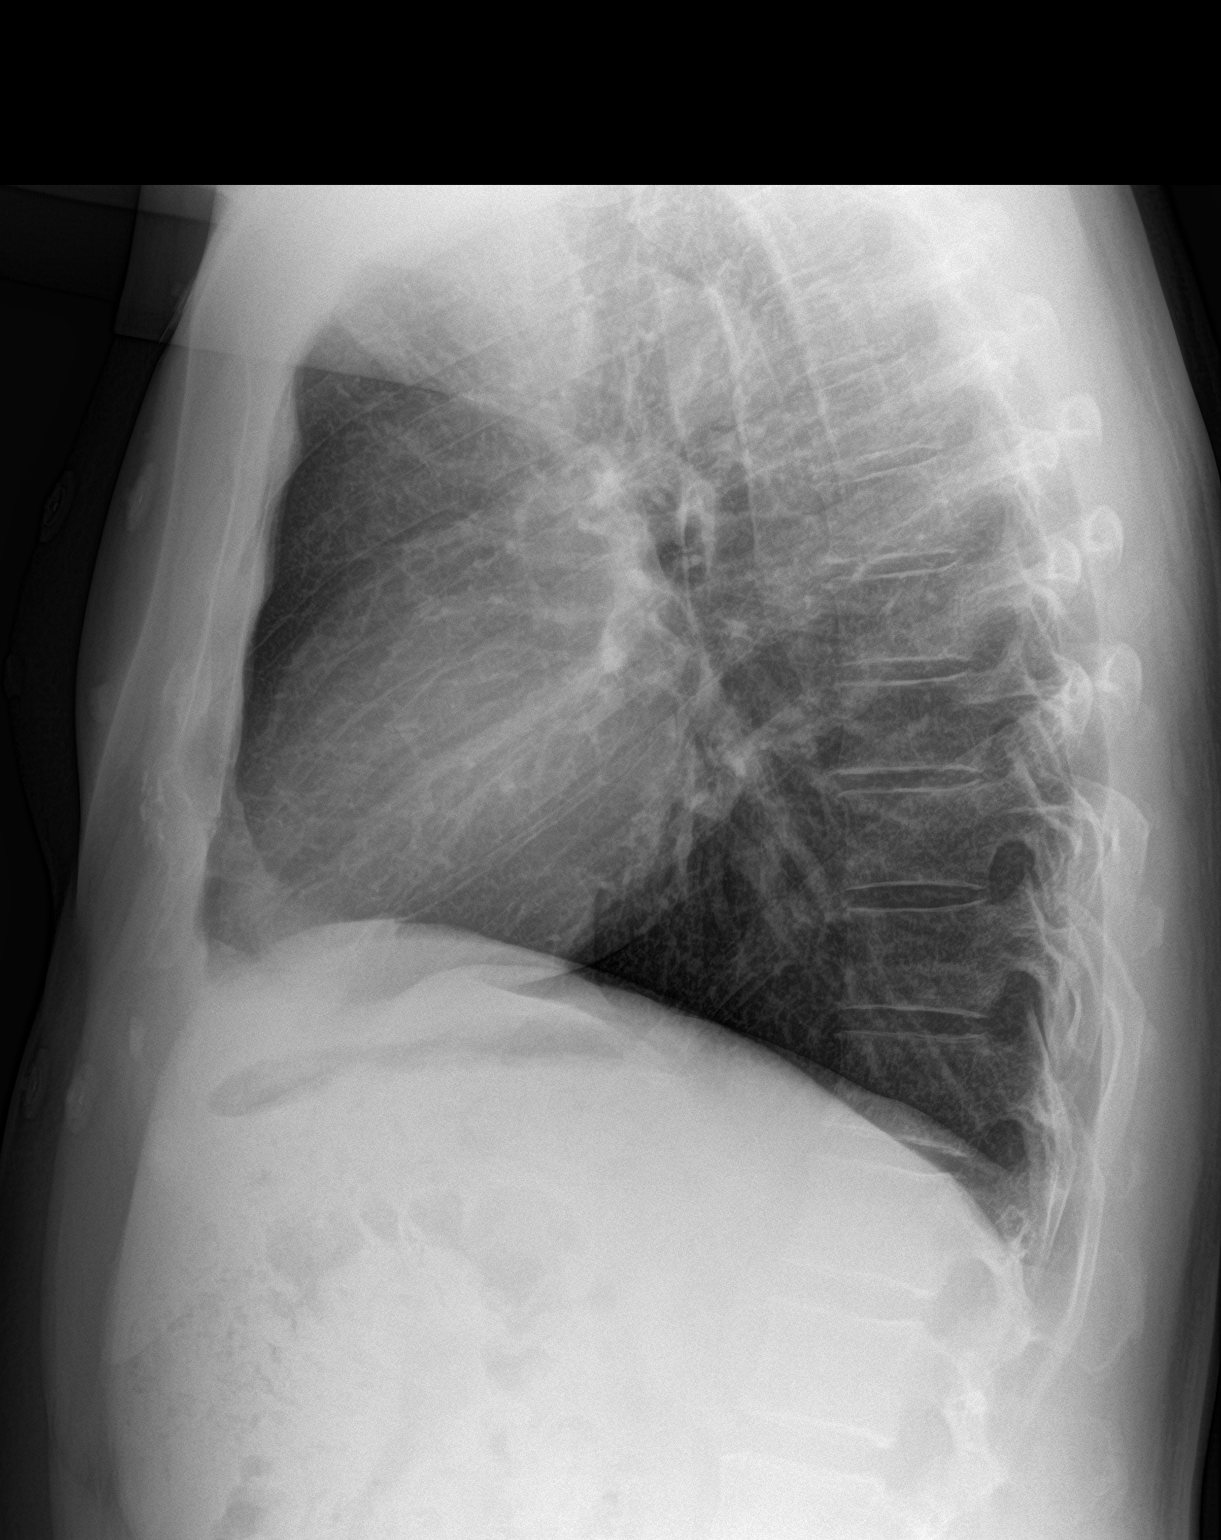

[2 of 2 positions shown; findings below may reference images not displayed]

FINDINGS: The heart size and mediastinal contours are within normal limits.
Both lungs are clear. The visualized skeletal structures are
unremarkable.
IMPRESSION: No active cardiopulmonary disease.

## 2023-02-10 DIAGNOSIS — K625 Hemorrhage of anus and rectum: Secondary | ICD-10-CM | POA: Insufficient documentation

## 2023-02-10 DIAGNOSIS — K5900 Constipation, unspecified: Secondary | ICD-10-CM | POA: Insufficient documentation

## 2023-02-11 ENCOUNTER — Encounter: Payer: Self-pay | Admitting: Internal Medicine

## 2023-02-11 ENCOUNTER — Ambulatory Visit: Payer: Self-pay | Admitting: Internal Medicine

## 2023-02-11 VITALS — BP 136/106 | HR 64 | Resp 16 | Ht 65.0 in | Wt 174.0 lb

## 2023-02-11 DIAGNOSIS — K5900 Constipation, unspecified: Secondary | ICD-10-CM

## 2023-02-11 DIAGNOSIS — R030 Elevated blood-pressure reading, without diagnosis of hypertension: Secondary | ICD-10-CM

## 2023-02-11 DIAGNOSIS — Z23 Encounter for immunization: Secondary | ICD-10-CM

## 2023-02-11 DIAGNOSIS — E7800 Pure hypercholesterolemia, unspecified: Secondary | ICD-10-CM

## 2023-02-11 DIAGNOSIS — R251 Tremor, unspecified: Secondary | ICD-10-CM

## 2023-02-11 NOTE — Progress Notes (Signed)
    Subjective:    Patient ID: Shaquan Missey, male   DOB: [birth year], 52 y.o.   MRN: [id number]   HPI  Requested no interpreter today   Elevated Blood Pressure:  His aunt, who he grew up with more as a sister, has liver cancer.  She lives here in [place] and sounds like her case is advanced to point not offering chemotherapy.  He also remains a bit stressed about purchase of property/home--waiting to hear about whether an offer they made will be accepted.   2.  Hypercholesterolemia:  He tries to exercise, but long work hours.  He is willing to make goals with exercise and diet.   3.  HM:  would like Shingles vaccine.  Not really interested in COVID  booster. He has had 3 covid vaccines.    4.  Sense of shaking inside a couple episodes when lying in bed.  Most of time at night when lying down to go to bed.  Has had about 8 episodes in past 3 months.  Lasts just a few seconds.  He cannot say he has paid attention to what he eats prior for dinner or snack.      No outpatient medications have been marked as taking for the 02/11/23 encounter (Office Visit) with Julieanne Manson, MD.   No Known Allergies   Review of Systems    Objective:   BP (!) 136/106 (BP Location: Left Arm, Patient Position: Sitting, Cuff Size: Normal)   Pulse 64   Resp 16   Ht 5\' 5"  (1.651 m)   Wt 174 lb (78.9 kg)   BMI 28.96 kg/m   Physical Exam NAD HEENT:  PERRL, EOMI Neck:  Supple, No adenopathy Chest:  CTA CV:  RRR without murmur or rub.  Radial and DP pulses normal and equal LE:  No edema.   Assessment & Plan   Hypercholesterolemia with good HDL and somewhat high LDL:  encouraged him to make definite goals with diet and walking daily to bring LDL down.    2.  Stress/grief:  Discussed could work with [employer], Morene Antu, or Hospice grief counseling.  He currently does not feel he needs this.    3.  Tremulousness:  to keep a diary of what he ate or activity performed prior to  feeling this when lies down for bed.  Not clear if this is anxiety, low blood sugar, etc.  4.  Elevated BP:  return for bp check in 2 weeks.    5.  Constipation and blood on tissue:  No longer using Metamucil and stools soft.  Had episode of diarrhea with straining and now with itching and bleeding likely from excess wiping.  Went over pat dry, cool sitz baths and Tucks to treat.  Avoid aggressive cleaning.  6.  HM:  Shingrix #1/2.  He would like a copy of his vaccines from growing up in [place].  Encouraged him to call and find out how far back the state registry goes.  We can only get his [place] vaccines.

## 2023-03-03 ENCOUNTER — Other Ambulatory Visit: Payer: Self-pay

## 2023-03-04 ENCOUNTER — Other Ambulatory Visit: Payer: Self-pay

## 2023-03-04 VITALS — BP 138/88 | HR 56

## 2023-03-04 DIAGNOSIS — Z0130 Encounter for examination of blood pressure without abnormal findings: Secondary | ICD-10-CM

## 2023-03-04 NOTE — Progress Notes (Unsigned)
Patient reported that he is having a headache this morning which may affect his bp.

## 2023-08-06 ENCOUNTER — Ambulatory Visit: Payer: Self-pay | Admitting: Internal Medicine

## 2023-08-06 ENCOUNTER — Encounter: Payer: Self-pay | Admitting: Internal Medicine

## 2023-08-06 VITALS — BP 130/88 | HR 64 | Resp 16 | Ht 65.0 in | Wt 164.0 lb

## 2023-08-06 DIAGNOSIS — Z23 Encounter for immunization: Secondary | ICD-10-CM

## 2023-08-06 DIAGNOSIS — Z1159 Encounter for screening for other viral diseases: Secondary | ICD-10-CM

## 2023-08-06 DIAGNOSIS — H43391 Other vitreous opacities, right eye: Secondary | ICD-10-CM

## 2023-08-06 DIAGNOSIS — Z125 Encounter for screening for malignant neoplasm of prostate: Secondary | ICD-10-CM

## 2023-08-06 DIAGNOSIS — Z0000 Encounter for general adult medical examination without abnormal findings: Secondary | ICD-10-CM

## 2023-08-06 DIAGNOSIS — E7800 Pure hypercholesterolemia, unspecified: Secondary | ICD-10-CM

## 2023-08-06 DIAGNOSIS — L719 Rosacea, unspecified: Secondary | ICD-10-CM

## 2023-08-06 MED ORDER — METRONIDAZOLE 1 % EX GEL: CUTANEOUS | 0 refills | Status: AC

## 2023-08-06 MED ORDER — DOXYCYCLINE HYCLATE 100 MG PO TABS: ORAL_TABLET | ORAL | 1 refills | Status: DC

## 2023-08-06 NOTE — Progress Notes (Signed)
 Subjective:    Patient ID: Joel Todd, male   DOB: 1971/08/05, 52 y.o.   MRN: [id number]   HPI  Here for Male CPE:  1.  STE:  Does not perform.  No family history of testicular cancer.  2.  PSA:  Last 08/2022 and normal at 1.3.  No family history of prostate cancer.    3.  Guaiac Cards/FIT:  Last 2017 and negative.  Did not return FIT last year.    4.  Colonoscopy:  Never.  No family history of colon cancer.    5.  Cholesterol/Glucose:  Hyperlipidemia, mild, but gradually increasing in past few years.  Glucose has been fine. Lipid Panel     Component Value Date/Time   CHOL 228 (H) 02/04/2023 0830   TRIG 110 02/04/2023 0830   HDL 62 02/04/2023 0830   CHOLHDL 3.8 Ratio 08/17/2008 2112   VLDL 15 08/17/2008 2112   LDLCALC 147 (H) 02/04/2023 0830   LABVLDL 19 02/04/2023 0830     6.  Immunizations: Has not had COVid booster and needs 2nd Shingrix.   Immunization History  Administered Date(s) Administered   Influenza Inj Mdck Quad Pf 09/01/2017   Influenza Split 09/29/2015   Influenza-Unspecified 06/06/2023   PFIZER(Purple Top)SARS-COV-2 Vaccination 11/16/2019, 12/08/2019, 09/03/2020   Tdap 03/22/2016   Zoster Recombinant(Shingrix) 02/11/2023     No outpatient medications have been marked as taking for the 08/06/23 encounter (Office Visit) with Julieanne Manson, MD.   No Known Allergies  Past Medical History:  Diagnosis Date   Allergic conjunctivitis    spring   Hypercholesterolemia 09/03/2017   Past Surgical History:  Procedure Laterality Date   APPENDECTOMY  2002   Family History  Problem Relation Age of Onset   Hypertension Mother    Benign prostatic hyperplasia Father    Hypertension Brother    Social History   Socioeconomic History   Marital status: Married    Spouse name: Doralee Albino   Number of children: 4   Years of education: 9   Highest education level: Not on file  Occupational History   Occupation: Retail banker  interior   Occupation: construction/remodeling  Tobacco Use   Smoking status: Never    Passive exposure: Never   Smokeless tobacco: Never  Vaping Use   Vaping status: Never Used  Substance and Sexual Activity   Alcohol use: Yes    Comment: once weekly-beer.  Sometimes more on weekend with a party   Drug use: No   Sexual activity: Yes    Partners: Female    Birth control/protection: Surgical    Comment: Wife had tubal ligation  Other Topics Concern   Not on file  Social History Narrative   Lives with his wife and 4 daughters       Social Determinants of Health   Financial Resource Strain: Low Risk  (08/06/2023)   Overall Financial Resource Strain (CARDIA)    Difficulty of Paying Living Expenses: Not hard at all  Food Insecurity: No Food Insecurity (08/06/2023)   Hunger Vital Sign    Worried About Running Out of Food in the Last Year: Never true    Ran Out of Food in the Last Year: Never true  Transportation Needs: No Transportation Needs (08/06/2023)   PRAPARE - Administrator, Civil Service (Medical): No    Lack of Transportation (Non-Medical): No  Physical Activity: Not on file  Stress: Not on file  Social Connections: Not on file  Intimate Partner  Violence: Not At Risk (08/06/2023)   Humiliation, Afraid, Rape, and Kick questionnaire    Fear of Current or Ex-Partner: No    Emotionally Abused: No    Physically Abused: No    Sexually Abused: No      Review of Systems  Eyes:  Positive for visual disturbance (Past 4 days, having a dark spot in his vision--if he blinks, it goes away.  Uses reading glasses and has good distant vision-unchanged.).  Respiratory:  Negative for shortness of breath.   Cardiovascular:  Negative for chest pain, palpitations and leg swelling.  Gastrointestinal:  Negative for abdominal pain and blood in stool (No melena).  Genitourinary:  Negative for difficulty urinating.  Musculoskeletal:        Bumps on DIP joints of fingers.         Objective:   BP 130/88 (BP Location: Right Arm, Patient Position: Sitting, Cuff Size: Normal)   Pulse 64   Resp 16   Ht 5\' 5"  (1.651 m)   Wt 164 lb (74.4 kg)   BMI 27.29 kg/m   Physical Exam HENT:     Head: Normocephalic and atraumatic.     Right Ear: Tympanic membrane, ear canal and external ear normal.     Left Ear: Tympanic membrane, ear canal and external ear normal.     Nose: Nose normal.     Mouth/Throat:     Mouth: Mucous membranes are moist.     Pharynx: Oropharynx is clear.  Eyes:     Extraocular Movements: Extraocular movements intact.     Conjunctiva/sclera: Conjunctivae normal.     Pupils: Pupils are equal, round, and reactive to light.     Comments: Unable to see right disc well as pupils small.  Neck:     Thyroid: No thyroid mass or thyromegaly.  Cardiovascular:     Rate and Rhythm: Normal rate and regular rhythm.     Heart sounds: S1 normal and S2 normal. No murmur heard.    No friction rub. No S3 or S4 sounds.     Comments: No carotid bruits.  Carotid, radial, femoral, DP and PT pulses normal and equal.   Pulmonary:     Effort: Pulmonary effort is normal.     Breath sounds: Normal breath sounds and air entry.  Abdominal:     General: Abdomen is flat. Bowel sounds are normal.     Palpations: Abdomen is soft. There is no hepatomegaly, splenomegaly or mass.     Tenderness: There is no abdominal tenderness.     Hernia: No hernia is present. There is no hernia in the left inguinal area or right inguinal area.  Genitourinary:    Penis: Normal and uncircumcised.      Testes:        Right: Mass or tenderness not present. Right testis is descended.        Left: Mass or tenderness not present. Left testis is descended.  Musculoskeletal:        General: Normal range of motion.     Cervical back: Normal range of motion and neck supple.     Right lower leg: No edema.     Left lower leg: No edema.     Comments: Heberdon's nodes, DIPs diffusely on fingers   Lymphadenopathy:     Head:     Right side of head: No submental or submandibular adenopathy.     Left side of head: No submental or submandibular adenopathy.     Cervical: No cervical  adenopathy.     Upper Body:     Right upper body: No supraclavicular or axillary adenopathy.     Left upper body: No supraclavicular or axillary adenopathy.     Lower Body: No right inguinal adenopathy. No left inguinal adenopathy.  Skin:    General: Skin is warm.     Capillary Refill: Capillary refill takes less than 2 seconds.     Findings: Rash (Redness and acneiform lesions on forehead, nose, cheeks, chin.) present.  Neurological:     General: No focal deficit present.     Mental Status: He is alert and oriented to person, place, and time.     Cranial Nerves: Cranial nerves 2-12 are intact.     Sensory: Sensation is intact.     Motor: Motor function is intact.     Coordination: Coordination is intact.     Gait: Gait is intact.     Deep Tendon Reflexes: Reflexes are normal and symmetric.  Psychiatric:        Speech: Speech normal.        Behavior: Behavior normal. Behavior is cooperative.      Assessment & Plan   CPE FIT to return in 2 weeks. Shingrix today. Encouraged to return for COVID booster.  He is not motivated to obtain.  Cannot clarify why. CBC, CMP, FLP, PSA, Hep C screen  2.  Right visual field floaters:  He does not have an orange card.  Recommended he get seen in next few days by optometry  3.  OA of fingers:  discussed ibuprofen or aleve and to take with food if necessary.    4.  Acne rosacea:  during exam, evident this was not controlled.  He states he went through 2 tubes of metrogel in the past without improvement.  Doxycycline 100 mg twice daily for 14 days and restart Metrogel.  To call if does not get this under control

## 2023-08-07 LAB — COMPREHENSIVE METABOLIC PANEL
ALT: 15 [IU]/L (ref 0–44)
AST: 21 [IU]/L (ref 0–40)
Albumin: 4.4 g/dL (ref 3.8–4.9)
Alkaline Phosphatase: 84 [IU]/L (ref 44–121)
BUN/Creatinine Ratio: 16 (ref 9–20)
BUN: 15 mg/dL (ref 6–24)
Bilirubin Total: 0.7 mg/dL (ref 0.0–1.2)
CO2: 24 mmol/L (ref 20–29)
Calcium: 9.5 mg/dL (ref 8.7–10.2)
Chloride: 101 mmol/L (ref 96–106)
Creatinine, Ser: 0.91 mg/dL (ref 0.76–1.27)
Globulin, Total: 2.9 g/dL (ref 1.5–4.5)
Glucose: 97 mg/dL (ref 70–99)
Potassium: 4.1 mmol/L (ref 3.5–5.2)
Sodium: 141 mmol/L (ref 134–144)
Total Protein: 7.3 g/dL (ref 6.0–8.5)
eGFR: 102 mL/min/{1.73_m2} (ref >59)

## 2023-08-07 LAB — LIPID PANEL W/O CHOL/HDL RATIO
Cholesterol, Total: 239 mg/dL — ABNORMAL HIGH (ref 100–199)
HDL: 74 mg/dL (ref >39)
LDL Chol Calc (NIH): 153 mg/dL — ABNORMAL HIGH (ref 0–99)
Triglycerides: 72 mg/dL (ref 0–149)
VLDL Cholesterol Cal: 12 mg/dL (ref 5–40)

## 2023-08-07 LAB — CBC WITH DIFFERENTIAL/PLATELET
Basophils Absolute: 0 10*3/uL (ref 0.0–0.2)
Basos: 1 %
EOS (ABSOLUTE): 0.1 10*3/uL (ref 0.0–0.4)
Eos: 2 %
Hematocrit: 48 % (ref 37.5–51.0)
Hemoglobin: 16.1 g/dL (ref 13.0–17.7)
Immature Grans (Abs): 0 10*3/uL (ref 0.0–0.1)
Immature Granulocytes: 0 %
Lymphocytes Absolute: 1.3 10*3/uL (ref 0.7–3.1)
Lymphs: 24 %
MCH: 31.1 pg (ref 26.6–33.0)
MCHC: 33.5 g/dL (ref 31.5–35.7)
MCV: 93 fL (ref 79–97)
Monocytes Absolute: 0.4 10*3/uL (ref 0.1–0.9)
Monocytes: 8 %
Neutrophils Absolute: 3.6 10*3/uL (ref 1.4–7.0)
Neutrophils: 65 %
Platelets: 253 10*3/uL (ref 150–450)
RBC: 5.18 x10E6/uL (ref 4.14–5.80)
RDW: 12.6 % (ref 11.6–15.4)
WBC: 5.5 10*3/uL (ref 3.4–10.8)

## 2023-08-07 LAB — HEPATITIS C ANTIBODY: Hep C Virus Ab: NONREACTIVE

## 2023-08-07 LAB — PSA: Prostate Specific Ag, Serum: 1.7 ng/mL (ref 0.0–4.0)

## 2023-08-22 ENCOUNTER — Other Ambulatory Visit: Payer: Self-pay

## 2023-08-22 DIAGNOSIS — Z1211 Encounter for screening for malignant neoplasm of colon: Secondary | ICD-10-CM

## 2023-08-22 LAB — POC FIT TEST STOOL: Fecal Occult Blood: NEGATIVE

## 2024-01-30 ENCOUNTER — Other Ambulatory Visit: Payer: Self-pay

## 2024-01-30 DIAGNOSIS — E7800 Pure hypercholesterolemia, unspecified: Secondary | ICD-10-CM

## 2024-01-31 LAB — LIPID PANEL W/O CHOL/HDL RATIO
Cholesterol, Total: 197 mg/dL (ref 100–199)
HDL: 65 mg/dL (ref >39)
LDL Chol Calc (NIH): 120 mg/dL — ABNORMAL HIGH (ref 0–99)
Triglycerides: 67 mg/dL (ref 0–149)
VLDL Cholesterol Cal: 12 mg/dL (ref 5–40)

## 2024-02-08 ENCOUNTER — Ambulatory Visit: Payer: Self-pay | Admitting: Internal Medicine

## 2024-08-06 ENCOUNTER — Other Ambulatory Visit: Payer: Self-pay

## 2024-08-06 DIAGNOSIS — Z0000 Encounter for general adult medical examination without abnormal findings: Secondary | ICD-10-CM

## 2024-08-07 LAB — COMPREHENSIVE METABOLIC PANEL WITH GFR
ALT: 12 IU/L (ref 0–44)
AST: 20 IU/L (ref 0–40)
Albumin: 4.8 g/dL (ref 3.8–4.9)
Alkaline Phosphatase: 80 IU/L (ref 47–123)
BUN/Creatinine Ratio: 20 (ref 9–20)
BUN: 18 mg/dL (ref 6–24)
Bilirubin Total: 0.7 mg/dL (ref 0.0–1.2)
CO2: 24 mmol/L (ref 20–29)
Calcium: 9.7 mg/dL (ref 8.7–10.2)
Chloride: 101 mmol/L (ref 96–106)
Creatinine, Ser: 0.9 mg/dL (ref 0.76–1.27)
Globulin, Total: 2.1 g/dL (ref 1.5–4.5)
Glucose: 95 mg/dL (ref 70–99)
Potassium: 4.3 mmol/L (ref 3.5–5.2)
Sodium: 139 mmol/L (ref 134–144)
Total Protein: 6.9 g/dL (ref 6.0–8.5)
eGFR: 103 mL/min/1.73 (ref >59)

## 2024-08-07 LAB — PSA: Prostate Specific Ag, Serum: 1.4 ng/mL (ref 0.0–4.0)

## 2024-08-07 LAB — CBC WITH DIFFERENTIAL/PLATELET
Basophils Absolute: 0 x10E3/uL (ref 0.0–0.2)
Basos: 0 %
EOS (ABSOLUTE): 0 x10E3/uL (ref 0.0–0.4)
Eos: 1 %
Hematocrit: 45.5 % (ref 37.5–51.0)
Hemoglobin: 15.6 g/dL (ref 13.0–17.7)
Immature Grans (Abs): 0 x10E3/uL (ref 0.0–0.1)
Immature Granulocytes: 0 %
Lymphocytes Absolute: 1.4 x10E3/uL (ref 0.7–3.1)
Lymphs: 26 %
MCH: 32.6 pg (ref 26.6–33.0)
MCHC: 34.3 g/dL (ref 31.5–35.7)
MCV: 95 fL (ref 79–97)
Monocytes Absolute: 0.4 x10E3/uL (ref 0.1–0.9)
Monocytes: 8 %
Neutrophils Absolute: 3.4 x10E3/uL (ref 1.4–7.0)
Neutrophils: 65 %
Platelets: 257 x10E3/uL (ref 150–450)
RBC: 4.79 x10E6/uL (ref 4.14–5.80)
RDW: 11.8 % (ref 11.6–15.4)
WBC: 5.2 x10E3/uL (ref 3.4–10.8)

## 2024-08-07 LAB — LIPID PANEL W/O CHOL/HDL RATIO
Cholesterol, Total: 227 mg/dL — ABNORMAL HIGH (ref 100–199)
HDL: 85 mg/dL (ref >39)
LDL Chol Calc (NIH): 133 mg/dL — ABNORMAL HIGH (ref 0–99)
Triglycerides: 50 mg/dL (ref 0–149)
VLDL Cholesterol Cal: 9 mg/dL (ref 5–40)

## 2024-08-12 ENCOUNTER — Encounter: Payer: Self-pay | Admitting: Internal Medicine

## 2024-08-12 ENCOUNTER — Ambulatory Visit: Payer: Self-pay | Admitting: Internal Medicine

## 2024-08-12 VITALS — BP 120/70 | HR 53 | Resp 12 | Ht 65.0 in | Wt 151.0 lb

## 2024-08-12 DIAGNOSIS — Z0120 Encounter for dental examination and cleaning without abnormal findings: Secondary | ICD-10-CM

## 2024-08-12 DIAGNOSIS — Z0000 Encounter for general adult medical examination without abnormal findings: Secondary | ICD-10-CM

## 2024-08-12 DIAGNOSIS — M7918 Myalgia, other site: Secondary | ICD-10-CM | POA: Insufficient documentation

## 2024-08-12 DIAGNOSIS — L219 Seborrheic dermatitis, unspecified: Secondary | ICD-10-CM | POA: Insufficient documentation

## 2024-08-12 MED ORDER — KETOCONAZOLE 2 % EX CREA: TOPICAL_CREAM | CUTANEOUS | 6 refills | Status: AC

## 2024-08-12 MED ORDER — IBUPROFEN 200 MG PO TABS: ORAL_TABLET | ORAL | Status: AC

## 2024-08-12 NOTE — Progress Notes (Signed)
 Subjective:    Patient ID: Joel Todd, male   DOB: 1970/10/24, 53 y.o.   MRN: [id number]   HPI  Here for Male CPE:  1.  STE:  Does perform.  No concerns.  No family history of testicular cancer.    2.  PSA: Last 1.4 in normal range.  No family history of prostate cancer.    3.  Guaiac Cards/FIT:  Last 12.2024 and negative for blood.  4.  Colonoscopy: Never.  No family history of colon cancer.   5.  Cholesterol/Glucose:  Cholesterol back up a bit, but mainly due to very high HDL.  Fasting glucose normal. Lipid Panel     Component Value Date/Time   CHOL 227 (H) 08/06/2024 0856   TRIG 50 08/06/2024 0856   HDL 85 08/06/2024 0856   CHOLHDL 3.8 Ratio 08/17/2008 2112   VLDL 15 08/17/2008 2112   LDLCALC 133 (H) 08/06/2024 0856   LABVLDL 9 08/06/2024 0856     6.  Immunizations: would like to think about COVID and pneumococcal vaccines.   Immunization History  Administered Date(s) Administered   Influenza Inj Mdck Quad Pf 09/01/2017   Influenza Split 09/29/2015   Influenza, Seasonal, Injecte, Preservative Fre 05/14/2024   Influenza-Unspecified 06/06/2023   PFIZER(Purple Top)SARS-COV-2 Vaccination 11/16/2019, 12/08/2019, 09/03/2020   Tdap 03/22/2016   Zoster Recombinant(Shingrix ) 02/11/2023, 08/06/2023       Active Medications[1]  Allergies[2]  Past Medical History:  Diagnosis Date   Allergic conjunctivitis    spring   Hypercholesterolemia 09/03/2017   Past Surgical History:  Procedure Laterality Date   APPENDECTOMY  2002   Family History  Problem Relation Age of Onset   Hypertension Mother    Benign prostatic hyperplasia Father    Hypertension Brother    Social History   Socioeconomic History   Marital status: Married    Spouse name: Bevely Sanders   Number of children: 4   Years of education: 9   Highest education level: Not on file  Occupational History   Occupation: Retail banker interior   Occupation: construction/remodeling   Tobacco Use   Smoking status: Never    Passive exposure: Never   Smokeless tobacco: Never  Vaping Use   Vaping status: Never Used  Substance and Sexual Activity   Alcohol use: Yes    Comment: once weekly-beer.  Sometimes more on weekend with a party   Drug use: No   Sexual activity: Yes    Partners: Female    Birth control/protection: Surgical    Comment: Wife had tubal ligation  Other Topics Concern   Not on file  Social History Narrative   Lives with his wife and 4 daughters       Social Drivers of Health   Tobacco Use: Low Risk (08/12/2024)   Patient History    Smoking Tobacco Use: Never    Smokeless Tobacco Use: Never    Passive Exposure: Never  Financial Resource Strain: Low Risk (08/06/2023)   Overall Financial Resource Strain (CARDIA)    Difficulty of Paying Living Expenses: Not hard at all  Food Insecurity: No Food Insecurity (08/12/2024)   Epic    Worried About Programme Researcher, Broadcasting/film/video in the Last Year: Never true    Ran Out of Food in the Last Year: Never true  Transportation Needs: No Transportation Needs (08/06/2023)   PRAPARE - Administrator, Civil Service (Medical): No    Lack of Transportation (Non-Medical): No  Physical Activity: Not on file  Stress: Not on file  Social Connections: Not on file  Intimate Partner Violence: Not At Risk (08/06/2023)   Humiliation, Afraid, Rape, and Kick questionnaire    Fear of Current or Ex-Partner: No    Emotionally Abused: No    Physically Abused: No    Sexually Abused: No  Depression (PHQ2-9): Not on file  Alcohol Screen: Not on file  Housing: Unknown (08/12/2024)   Epic    Unable to Pay for Housing in the Last Year: No    Number of Times Moved in the Last Year: Not on file    Homeless in the Last Year: No  Utilities: Not At Risk (08/06/2023)   [facility] Utilities    Threatened with loss of utilities: No  Health Literacy: Not on file      Review of Systems  HENT:  Negative for dental problem.   Eyes:   Negative for visual disturbance (Just needs reading glasses.  He also has a presciription for bifocals, but they get dirty with work, so does not use.).  Respiratory:  Negative for shortness of breath.   Cardiovascular:  Negative for chest pain.  Gastrointestinal:  Negative for abdominal pain and blood in stool (No melena.).  Musculoskeletal:  Positive for arthralgias (Hears a click in high right gluteal area when sits on toilet and when hears the click, ultimately develops pain.  Bothers him in bed and also after carrying heavy objects.   Burning pain that does not radiate.).  Skin:        Redness and papular changes did not improve with Doxy nor topical Metronidazole .  He does not flaking in glabellar area and nasalabial folds as well as into beard and also at frontal hairline.      Objective:   BP 120/70 (BP Location: Right Arm, Patient Position: Sitting, Cuff Size: Normal)   Pulse (!) 53   Resp 12   Ht 5' 5 (1.651 m)   Wt 151 lb (68.5 kg)   BMI 25.13 kg/m   Physical Exam Constitutional:      Appearance: He is normal weight.  HENT:     Head: Normocephalic and atraumatic.     Right Ear: Tympanic membrane, ear canal and external ear normal.     Left Ear: Tympanic membrane, ear canal and external ear normal.     Nose: Nose normal.     Mouth/Throat:     Mouth: Mucous membranes are moist.     Pharynx: Oropharynx is clear.  Eyes:     Extraocular Movements: Extraocular movements intact.     Conjunctiva/sclera: Conjunctivae normal.     Pupils: Pupils are equal, round, and reactive to light.     Comments: Discs sharp  Neck:     Thyroid: No thyroid mass or thyromegaly.  Cardiovascular:     Rate and Rhythm: Normal rate and regular rhythm.     Heart sounds: S1 normal and S2 normal. No murmur heard.    No friction rub. No S3 or S4 sounds.     Comments: No carotid bruits.  Carotid, radial, femoral, DP and PT pulses normal and equal.   Pulmonary:     Effort: Pulmonary effort is  normal.     Breath sounds: Normal breath sounds and air entry.  Abdominal:     General: Abdomen is flat. Bowel sounds are normal.     Palpations: Abdomen is soft. There is no hepatomegaly, splenomegaly or mass.     Tenderness: There is no abdominal tenderness.  Hernia: No hernia is present. There is no hernia in the left inguinal area or right inguinal area.  Genitourinary:    Penis: Normal and uncircumcised.      Testes:        Right: Mass or tenderness not present. Right testis is descended.        Left: Mass or tenderness not present. Left testis is descended.  Musculoskeletal:        General: Normal range of motion.     Cervical back: Normal range of motion and neck supple.     Right lower leg: No edema.     Left lower leg: No edema.     Comments: Mild point tenderness at upper outer right gluteal area.  Discomfort with external rotation and abduction with right knee bent and ankle crossed over left knee.  Lymphadenopathy:     Head:     Right side of head: No submental or submandibular adenopathy.     Left side of head: No submental or submandibular adenopathy.     Cervical: No cervical adenopathy.     Upper Body:     Right upper body: No supraclavicular adenopathy.     Left upper body: No supraclavicular adenopathy.     Lower Body: No right inguinal adenopathy. No left inguinal adenopathy.  Skin:    General: Skin is warm.     Capillary Refill: Capillary refill takes less than 2 seconds.     Comments: Erythema with raised papules on cheeks, nose and glabellar area.   Erythema does involve nasolabial folds Today, note flaking along eyebrows and nasolabial folds and into beard area  Neurological:     General: No focal deficit present.     Mental Status: He is alert and oriented to person, place, and time.     Cranial Nerves: Cranial nerves 2-12 are intact.     Sensory: Sensation is intact.     Motor: Motor function is intact.     Coordination: Coordination is intact.      Gait: Gait is intact.     Deep Tendon Reflexes: Reflexes are normal and symmetric.  Psychiatric:        Mood and Affect: Mood normal.        Behavior: Behavior normal.      Assessment & Plan   CPE  Will think about COVID vaccination FIT to return in 2 weeks  2.  Hypercholesterolemia:  Mild with high HDL.  Follow for now.  Do not want LDL to increase further.  Work on lifestyle changes.  3.  Seborrhea:  Not Acne Rosacea.  Ketoconazole 2% cream to face daily and 1% shampoo 2-3 times weekly to beard and scalp hair.    4.  Right gluteal complex tendinitis:  Went over stretches and strengthening.    Referral to [facility]--not clear he will attend.  5.  Need for dental care:  Dental clinic referral.         [1]  No outpatient medications have been marked as taking for the 08/12/24 encounter (Office Visit) with Adella Norris, MD.  [2] No Known Allergies

## 2025-08-12 ENCOUNTER — Other Ambulatory Visit: Payer: Self-pay | Admitting: Internal Medicine

## 2025-08-17 ENCOUNTER — Encounter: Payer: Self-pay | Admitting: Internal Medicine
